# Patient Record
Sex: Female | Born: 1945 | Race: Black or African American | Hispanic: No | Smoking: Never smoker
Health system: Southern US, Community
[De-identification: ages and names within clinical notes are randomized; demographics above are authoritative.]

## PROBLEM LIST (undated history)

## (undated) DIAGNOSIS — D649 Anemia, unspecified: Secondary | ICD-10-CM

## (undated) DIAGNOSIS — I340 Nonrheumatic mitral (valve) insufficiency: Secondary | ICD-10-CM

## (undated) DIAGNOSIS — M1990 Unspecified osteoarthritis, unspecified site: Secondary | ICD-10-CM

## (undated) DIAGNOSIS — E785 Hyperlipidemia, unspecified: Secondary | ICD-10-CM

## (undated) DIAGNOSIS — I428 Other cardiomyopathies: Secondary | ICD-10-CM

## (undated) DIAGNOSIS — N184 Chronic kidney disease, stage 4 (severe): Secondary | ICD-10-CM

## (undated) DIAGNOSIS — I509 Heart failure, unspecified: Secondary | ICD-10-CM

## (undated) DIAGNOSIS — I10 Essential (primary) hypertension: Secondary | ICD-10-CM

## (undated) DIAGNOSIS — E119 Type 2 diabetes mellitus without complications: Secondary | ICD-10-CM

## (undated) HISTORY — PX: ACHILLES TENDON SURGERY: SHX542

## (undated) HISTORY — PX: KNEE SURGERY: SHX244

---

## 1998-02-19 ENCOUNTER — Other Ambulatory Visit: Admission: RE | Admit: 1998-02-19 | Discharge: 1998-02-19 | Payer: Self-pay | Admitting: Obstetrics

## 1999-05-12 ENCOUNTER — Other Ambulatory Visit: Admission: RE | Admit: 1999-05-12 | Discharge: 1999-05-12 | Payer: Self-pay | Admitting: Obstetrics

## 1999-12-12 ENCOUNTER — Encounter (INDEPENDENT_AMBULATORY_CARE_PROVIDER_SITE_OTHER): Payer: Self-pay

## 1999-12-12 ENCOUNTER — Ambulatory Visit (HOSPITAL_COMMUNITY): Admission: RE | Admit: 1999-12-12 | Discharge: 1999-12-12 | Payer: Self-pay | Admitting: *Deleted

## 2001-04-15 ENCOUNTER — Encounter: Payer: Self-pay | Admitting: Cardiovascular Disease

## 2001-04-15 ENCOUNTER — Ambulatory Visit (HOSPITAL_COMMUNITY): Admission: RE | Admit: 2001-04-15 | Discharge: 2001-04-15 | Payer: Self-pay | Admitting: Cardiovascular Disease

## 2002-02-17 ENCOUNTER — Ambulatory Visit (HOSPITAL_COMMUNITY): Admission: RE | Admit: 2002-02-17 | Discharge: 2002-02-17 | Payer: Self-pay | Admitting: *Deleted

## 2002-02-17 ENCOUNTER — Encounter (INDEPENDENT_AMBULATORY_CARE_PROVIDER_SITE_OTHER): Payer: Self-pay | Admitting: Specialist

## 2002-04-13 ENCOUNTER — Emergency Department (HOSPITAL_COMMUNITY): Admission: EM | Admit: 2002-04-13 | Discharge: 2002-04-13 | Payer: Self-pay | Admitting: Emergency Medicine

## 2002-04-13 ENCOUNTER — Encounter: Payer: Self-pay | Admitting: Emergency Medicine

## 2002-05-24 ENCOUNTER — Encounter: Admission: RE | Admit: 2002-05-24 | Discharge: 2002-05-24 | Payer: Self-pay | Admitting: Orthopedic Surgery

## 2002-05-24 ENCOUNTER — Encounter: Payer: Self-pay | Admitting: Orthopedic Surgery

## 2002-09-12 ENCOUNTER — Encounter: Admission: RE | Admit: 2002-09-12 | Discharge: 2002-09-12 | Payer: Self-pay | Admitting: Orthopedic Surgery

## 2002-09-12 ENCOUNTER — Encounter: Payer: Self-pay | Admitting: Orthopedic Surgery

## 2002-10-03 ENCOUNTER — Encounter: Payer: Self-pay | Admitting: Orthopedic Surgery

## 2002-10-03 ENCOUNTER — Encounter: Admission: RE | Admit: 2002-10-03 | Discharge: 2002-10-03 | Payer: Self-pay | Admitting: Orthopedic Surgery

## 2005-06-07 ENCOUNTER — Encounter: Admission: RE | Admit: 2005-06-07 | Discharge: 2005-06-07 | Payer: Self-pay | Admitting: Orthopedic Surgery

## 2006-01-05 ENCOUNTER — Encounter: Admission: RE | Admit: 2006-01-05 | Discharge: 2006-01-05 | Payer: Self-pay | Admitting: Orthopedic Surgery

## 2007-07-06 ENCOUNTER — Encounter: Admission: RE | Admit: 2007-07-06 | Discharge: 2007-07-06 | Payer: Self-pay | Admitting: Cardiovascular Disease

## 2007-12-14 ENCOUNTER — Ambulatory Visit (HOSPITAL_COMMUNITY): Admission: RE | Admit: 2007-12-14 | Discharge: 2007-12-14 | Payer: Self-pay | Admitting: Cardiovascular Disease

## 2008-09-11 ENCOUNTER — Encounter: Admission: RE | Admit: 2008-09-11 | Discharge: 2008-09-11 | Payer: Self-pay | Admitting: Cardiovascular Disease

## 2009-06-12 ENCOUNTER — Encounter: Admission: RE | Admit: 2009-06-12 | Discharge: 2009-06-12 | Payer: Self-pay | Admitting: Cardiovascular Disease

## 2009-07-27 ENCOUNTER — Encounter: Admission: RE | Admit: 2009-07-27 | Discharge: 2009-07-27 | Payer: Self-pay | Admitting: Orthopedic Surgery

## 2009-12-06 ENCOUNTER — Ambulatory Visit (HOSPITAL_COMMUNITY): Admission: RE | Admit: 2009-12-06 | Discharge: 2009-12-06 | Payer: Self-pay | Admitting: Cardiovascular Disease

## 2011-04-17 NOTE — Op Note (Signed)
[name]  Patient:    Dana Walters                           MRN: [id number] Proc. Date: 12/12/99 Adm. Date:  [id number] Attending:  Woody Seller                           Operative Report  PREOPERATIVE DIAGNOSIS:  History of colon polyps.  POSTOPERATIVE DIAGNOSES: 1. Polypoid lesion removed from the proximal ascending colon. 2. Diverticulosis noted.  OPERATION:  Colonoscopy with hot biopsy.  SURGEON:  Woody Seller., M.D.  PREMEDICATION:  Demerol 100 mg IV and Versed 10 mg IV over 10 minute period of time.  INSTRUMENTS:  Olympus video pancolonoscope.  INDICATION:  This pleasant 65 year old female was diagnosed as having a history of colon polyps in the past.  She was subsequently returned back for re-evaluation of the area at this time.  She denies any major complaints of discomfort that is present at this time.  PHYSICAL EXAMINATION:  GENERAL:  She is a pleasant female who appears to be in no acute distress.  VITAL SIGNS:  Stable.  HEENT:  Anicteric.  NECK:  Supple.  LUNGS:  Clear.  HEART:  Regular rate and rhythm without heaves, thrills, murmurs, or gallops.  ABDOMEN:  Soft.  No tenderness.  No hepatosplenomegaly.  EXTREMITIES:  Grossly within normal limits.  PLAN:  I am going to proceed with the colonoscopic examination.  INFORMED CONSENT:  The patient was advised of the procedure, indications, and the risks involved.  The patient has agreed to have the procedure performed.  DESCRIPTION OF PROCEDURE:  The patient was brought to the endoscopy unit where n IV for IV conscious sedating medication was started.  The monitor was placed on the patient to monitor the patients vital signs and oxygen saturation.  Nasal oxygen at two liters per minute was used.  After adequate sedation was performed, the procedure was begun.  The patient was given GoLYTELY and Reglan as a bowel prep.  The patient tolerated the prep  well without any complications.  The quality of the prep was excellent.  The instrument was advanced to the patient lying in the left lateral position approximately 80 cm proximal colon to the cecum.  This was confirmed by palpation, transillumination, and visualization of the ileocecal valve as well as the terminal ileum that was noted.  There appeared to be evidence of a colonic polyp that was appreciated in the proximal ascending colon distal to the cecal pouch that was noted.  This polypoid lesion was removed via hot biopsy at this time.  The size of the polyp appeared to be approximately 0.6 to 0.7 cm in size.  Otherwise there were no other masses, polyps, or strictures appreciated at this time.  The vascular pattern appeared to be well within normal limits throughout the entire colon.  The mucosal pattern showed evidence of diverticulosis present throughout the colon area that was noted.  The diverticulae appeared to be scattered in character and small in nature.  There is no evidence of any inflammatory changes or other diverticulae that was noted or any evidence of complications from the diverticulae that was noted.  There was no increased tortuosity of the colon that was noted nor any evidence f internal or external hemorrhoids upon exiting from the area.  The patient tolerated the procedure well without any  complications that was noted.  TREATMENT:  Conservative management at this time.  Await the pathology report of the biopsy that was obtained.  Would recommend repeating the procedure in approximately one to one and a half years.  We will have follow-up in the office during the interim otherwise.  LEVEL OF DIFFICULTY:  Approximately 3 out of 10.  RECOMMENDATIONS:  Would recommend continuing the same colonoscope without change at this time. DD:  12/12/99 TD:  12/12/99 Job: [id number]

## 2011-04-23 ENCOUNTER — Other Ambulatory Visit: Payer: Self-pay | Admitting: Cardiovascular Disease

## 2011-04-23 ENCOUNTER — Ambulatory Visit
Admission: RE | Admit: 2011-04-23 | Discharge: 2011-04-23 | Disposition: A | Discharge status: Home / Self Care | Payer: Medicare Other | Source: Ambulatory Visit | Attending: Cardiovascular Disease | Admitting: Cardiovascular Disease

## 2011-04-23 DIAGNOSIS — J40 Bronchitis, not specified as acute or chronic: Secondary | ICD-10-CM

## 2013-06-01 ENCOUNTER — Emergency Department (HOSPITAL_COMMUNITY)
Admission: EM | Admit: 2013-06-01 | Discharge: 2013-06-01 | Disposition: A | Discharge status: Home / Self Care | Payer: Medicare Other | Attending: Emergency Medicine | Admitting: Emergency Medicine

## 2013-06-01 ENCOUNTER — Encounter (HOSPITAL_COMMUNITY): Payer: Self-pay | Admitting: Emergency Medicine

## 2013-06-01 DIAGNOSIS — Z79899 Other long term (current) drug therapy: Secondary | ICD-10-CM | POA: Insufficient documentation

## 2013-06-01 DIAGNOSIS — I10 Essential (primary) hypertension: Secondary | ICD-10-CM

## 2013-06-01 DIAGNOSIS — M549 Dorsalgia, unspecified: Secondary | ICD-10-CM

## 2013-06-01 DIAGNOSIS — M545 Low back pain, unspecified: Secondary | ICD-10-CM | POA: Insufficient documentation

## 2013-06-01 DIAGNOSIS — M129 Arthropathy, unspecified: Secondary | ICD-10-CM | POA: Insufficient documentation

## 2013-06-01 DIAGNOSIS — E119 Type 2 diabetes mellitus without complications: Secondary | ICD-10-CM | POA: Insufficient documentation

## 2013-06-01 DIAGNOSIS — M1990 Unspecified osteoarthritis, unspecified site: Secondary | ICD-10-CM

## 2013-06-01 DIAGNOSIS — R51 Headache: Secondary | ICD-10-CM | POA: Insufficient documentation

## 2013-06-01 HISTORY — DX: Essential (primary) hypertension: I10

## 2013-06-01 HISTORY — DX: Type 2 diabetes mellitus without complications: E11.9

## 2013-06-01 HISTORY — DX: Unspecified osteoarthritis, unspecified site: M19.90

## 2013-06-01 MED ORDER — CYCLOBENZAPRINE HCL 10 MG PO TABS: 10.0000 mg | ORAL_TABLET | Freq: Two times a day (BID) | ORAL | Status: DC | PRN

## 2013-06-01 MED ORDER — HYDROCODONE-ACETAMINOPHEN 5-325 MG PO TABS
2.0000 | ORAL_TABLET | Freq: Once | ORAL | Status: AC
  Administered 2013-06-01: 2 via ORAL
  Filled 2013-06-01: qty 2

## 2013-06-01 MED ORDER — DIAZEPAM 5 MG PO TABS
5.0000 mg | ORAL_TABLET | Freq: Once | ORAL | Status: AC
  Administered 2013-06-01: 5 mg via ORAL
  Filled 2013-06-01: qty 1

## 2013-06-01 MED ORDER — HYDROCODONE-ACETAMINOPHEN 5-325 MG PO TABS: 1.0000 | ORAL_TABLET | Freq: Once | ORAL | Status: DC

## 2013-06-01 MED ORDER — DIPHENHYDRAMINE HCL 25 MG PO CAPS
25.0000 mg | ORAL_CAPSULE | Freq: Once | ORAL | Status: AC
  Administered 2013-06-01: 25 mg via ORAL
  Filled 2013-06-01: qty 1

## 2013-06-01 MED ORDER — IBUPROFEN 400 MG PO TABS
600.0000 mg | ORAL_TABLET | Freq: Once | ORAL | Status: DC
  Filled 2013-06-01 (×2): qty 1

## 2013-06-01 MED ORDER — HYDROCODONE-ACETAMINOPHEN 5-325 MG PO TABS: 2.0000 | ORAL_TABLET | ORAL | Status: DC | PRN

## 2013-06-01 MED ORDER — TRAMADOL HCL 50 MG PO TABS
50.0000 mg | ORAL_TABLET | Freq: Once | ORAL | Status: AC
  Administered 2013-06-01: 50 mg via ORAL
  Filled 2013-06-01: qty 1

## 2013-06-01 MED ORDER — NAPROXEN 375 MG PO TABS: 375.0000 mg | ORAL_TABLET | Freq: Two times a day (BID) | ORAL | Status: DC

## 2013-06-01 MED ORDER — DIAZEPAM 5 MG PO TABS
5.0000 mg | ORAL_TABLET | Freq: Once | ORAL | Status: AC
  Administered 2013-06-01: 5 mg via ORAL
  Filled 2013-06-01 (×2): qty 1

## 2013-06-01 NOTE — ED Provider Notes (Signed)
History    CSN: [id number] Arrival date & time 06/01/13  1024  First MD Initiated Contact with Patient 06/01/13 1058     Chief Complaint  Patient presents with  . Back Pain   (Consider location/radiation/quality/duration/timing/severity/associated sxs/prior Treatment) The history is provided by the patient. No language interpreter was used.  Dana Walters is a 67 y/o F with PMHx of HTN, DM, Arthritis, back pain, presenting to the ED, with family, with back pain that became severe starting yesterday after patient was lifting pails of water due to a plumbing issue - was using crutch underneath right arm to keep balance - normally using crutches to walk. Patient reported that the pain is mainly localized to the lower back with mild radiation to the left buttocks and left posterior aspect of thigh, described as being "pulled apart" constant pain. Patient reported that the pain gets worse with motion, hurts to sit down and get up from the toilet, but being in a sit-up position makes the pain feel better. Reported that she was using back patches - denied medications. Reported that she has had back pain for the past 7-8 years, daughter reported that she was supposed to get surgery, but kept putting it off. Denied fever, chills, numbness, tingling, fall, injury, urinary incontinence, bowel incontinence.  Patient reported that she has been using crutches since 1994 PCP Dr. Doylene Canard  Past Medical History  Diagnosis Date  . Hypertension   . Diabetes mellitus without complication   . Arthritis    Past Surgical History  Procedure Laterality Date  . Achilles tendon surgery    . Knee surgery     No family history on file. History  Substance Use Topics  . Smoking status: Never Smoker   . Smokeless tobacco: Not on file  . Alcohol Use: Yes   OB History   Grav Para Term Preterm Abortions TAB SAB Ect Mult Living                 Review of Systems  Constitutional: Negative for fever and chills.   HENT: Negative for neck pain and neck stiffness.   Eyes: Negative for visual disturbance.  Respiratory: Negative for chest tightness and shortness of breath.   Cardiovascular: Negative for chest pain.  Genitourinary: Negative for frequency and difficulty urinating.  Musculoskeletal: Positive for back pain.  Neurological: Positive for headaches. Negative for dizziness, weakness, light-headedness and numbness.  All other systems reviewed and are negative.    Allergies  Percocet and Sular  Home Medications   Current Outpatient Rx  Name  Route  Sig  Dispense  Refill  . cloNIDine (CATAPRES) 0.2 MG tablet   Oral   Take 0.2 mg by mouth at bedtime.         Marland Kitchen esomeprazole (NEXIUM) 40 MG capsule   Oral   Take 40 mg by mouth daily before breakfast.         . fenofibrate (TRICOR) 145 MG tablet   Oral   Take 145 mg by mouth daily.         . furosemide (LASIX) 40 MG tablet   Oral   Take 40 mg by mouth daily.         Marland Kitchen gabapentin (NEURONTIN) 300 MG capsule   Oral   Take 300 mg by mouth 2 (two) times daily as needed (pain).         . metFORMIN (GLUCOPHAGE) 500 MG tablet   Oral   Take 500 mg by mouth 2 (  two) times daily with a meal.         . metoprolol succinate (TOPROL-XL) 100 MG 24 hr tablet   Oral   Take 100 mg by mouth daily. Take with or immediately following a meal.         . olmesartan (BENICAR) 40 MG tablet   Oral   Take 40 mg by mouth daily.         . potassium chloride SA (K-DUR,KLOR-CON) 20 MEQ tablet   Oral   Take 20 mEq by mouth daily.         . sitaGLIPtin (JANUVIA) 100 MG tablet   Oral   Take 100 mg by mouth daily.         . cyclobenzaprine (FLEXERIL) 10 MG tablet   Oral   Take 1 tablet (10 mg total) by mouth 2 (two) times daily as needed for muscle spasms.   20 tablet   0   . HYDROcodone-acetaminophen (NORCO/VICODIN) 5-325 MG per tablet   Oral   Take 2 tablets by mouth every 4 (four) hours as needed for pain.   11 tablet   0    . naproxen (NAPROSYN) 375 MG tablet   Oral   Take 1 tablet (375 mg total) by mouth 2 (two) times daily.   20 tablet   0    BP 161/90  Pulse 78  Temp(Src) 100.4 F (38 C) (Oral)  Resp 18  SpO2 93% Physical Exam  Nursing note and vitals reviewed. Constitutional: She is oriented to person, place, and time. She appears well-developed and well-nourished. No distress.  HENT:  Head: Normocephalic and atraumatic.  Eyes: Conjunctivae and EOM are normal. Pupils are equal, round, and reactive to light. Right eye exhibits no discharge. Left eye exhibits no discharge.  Neck: Normal range of motion. Neck supple.  Cardiovascular: Normal rate, regular rhythm and normal heart sounds.  Exam reveals no friction rub.   No murmur heard. Pulses:      Radial pulses are 2+ on the right side, and 2+ on the left side.       Dorsalis pedis pulses are 2+ on the right side, and 2+ on the left side.  Pulmonary/Chest: Effort normal and breath sounds normal. No respiratory distress. She has no wheezes. She has no rales.  Musculoskeletal: She exhibits tenderness.  Mild decreased ROM to upper and lower extremities secondary to lower back pain Pain upon palpation to the lumbosacral region - mainly to the left  Straight leg raise normal Strength 5+/5+ to upper and lower extremities with resistance  Lymphadenopathy:    She has no cervical adenopathy.  Neurological: She is alert and oriented to person, place, and time. No cranial nerve deficit. She exhibits normal muscle tone. Coordination normal.  Cranial nerves III-XII grossly intact Sensation intact to upper and lower extremities with differentiation to sharp and dull touch  Skin: Skin is warm and dry. No rash noted. She is not diaphoretic. No erythema.  Psychiatric: She has a normal mood and affect. Her behavior is normal. Thought content normal.    ED Course  Procedures (including critical care time) Labs Reviewed - No data to display No results  found. 1. Back pain   2. HTN (hypertension)   3. DM (diabetes mellitus)   4. Arthritis     MDM  Patient presenting to the ED with back pain that has been ongoing for the past 7-8 years - was due to get surgery performed approximately 7 years ago - as  per daughter, but patient kept pushing it off. Negative new injury noted. Negative neurological deficits noted. Negative urinary and bowel issues noted - doubt cauda equina. Patient given pain medications in ED setting - pain improved. Patient stable, afebrile. Discharged patient with anti-inflammatories, muscle relaxer, and pain medications - discussed course, precautions, disposal. Referred patient to spine specialist and PCP. Discussed with patient that she will need an MRI as outpatient to know status of back. Discussed with patient to rest and stay hydrated. Discussed with patient to continue to monitor symptoms and if symptoms are to worsen or change to report back to the ED - strict return instructions given. Patient and family agreed to plan of care, understood, all questions answered.   Jamse Mead, PA-C 06/01/13 1710

## 2013-06-01 NOTE — ED Notes (Signed)
Patient states she has had back problems 7 to 8 years.   Patient states that she was lifting water a couple of days ago and possibly hurt it then.

## 2013-06-02 NOTE — ED Provider Notes (Signed)
Medical screening examination/treatment/procedure(s) were performed by non-physician practitioner and as supervising physician I was immediately available for consultation/collaboration.    Orpah Greek, MD 06/02/13 2330

## 2013-12-11 ENCOUNTER — Encounter (HOSPITAL_COMMUNITY): Payer: Self-pay | Admitting: Emergency Medicine

## 2013-12-11 ENCOUNTER — Emergency Department (HOSPITAL_COMMUNITY)
Admission: EM | Admit: 2013-12-11 | Discharge: 2013-12-11 | Disposition: A | Discharge status: Home / Self Care | Payer: Medicare Other | Attending: Emergency Medicine | Admitting: Emergency Medicine

## 2013-12-11 DIAGNOSIS — Z79899 Other long term (current) drug therapy: Secondary | ICD-10-CM | POA: Insufficient documentation

## 2013-12-11 DIAGNOSIS — I10 Essential (primary) hypertension: Secondary | ICD-10-CM | POA: Insufficient documentation

## 2013-12-11 DIAGNOSIS — M129 Arthropathy, unspecified: Secondary | ICD-10-CM | POA: Insufficient documentation

## 2013-12-11 DIAGNOSIS — L519 Erythema multiforme, unspecified: Secondary | ICD-10-CM

## 2013-12-11 DIAGNOSIS — E119 Type 2 diabetes mellitus without complications: Secondary | ICD-10-CM | POA: Insufficient documentation

## 2013-12-11 MED ORDER — HYDROXYZINE HCL 25 MG PO TABS: 25.0000 mg | ORAL_TABLET | Freq: Four times a day (QID) | ORAL | Status: DC

## 2013-12-11 NOTE — ED Notes (Signed)
Pt reports has not been taking her lasix and has pitting edema to BLE and mild edema to BUE. Pt denies SOB/CP and is in NAD at this time

## 2013-12-11 NOTE — ED Notes (Signed)
Pt reports developing a rash on bilateral legs 5 days ago that has progressively spread to both arms. Denies changing detergents, soaps, or perfume. Reports applying benadryl cream to the sites with no relief. Pt has not taken benadryl tablets for the rash. Denies swelling of throat, mouth, or anywhere on body. Denies pain at this time.

## 2013-12-11 NOTE — ED Notes (Signed)
Robyn PA at bedside

## 2013-12-11 NOTE — ED Notes (Signed)
Patient states she started getting "rash" on her arms, legs.   Patient states it "itches and burns".   Patient denies any other symptoms.

## 2013-12-11 NOTE — ED Provider Notes (Signed)
CSN: [id number]     Arrival date & time 12/11/13  1011 History   None     Chief Complaint  Patient presents with  . Rash   (Consider location/radiation/quality/duration/timing/severity/associated sxs/prior Treatment) The history is provided by the patient. No language interpreter was used.   HPI Comments: Dana Walters is a 68 y.o. female who presents to the Emergency Department complaining of constant worsening rash on arms and legs onset 7 days. Describes rash as spreading, pruritic, and burning. Additionally, reports baseline shortness of breath. Reports symptoms are exacerbated at night. Denies associated fever, new medications, chest pain,  Reports she is out of her medications.   Past Medical History  Diagnosis Date  . Hypertension   . Diabetes mellitus without complication   . Arthritis    Past Surgical History  Procedure Laterality Date  . Achilles tendon surgery    . Knee surgery     No family history on file. History  Substance Use Topics  . Smoking status: Never Smoker   . Smokeless tobacco: Not on file  . Alcohol Use: Yes   OB History   Grav Para Term Preterm Abortions TAB SAB Ect Mult Living                 Review of Systems All other systems negative except as documented in the HPI. All pertinent positives and negatives as reviewed in the HPI. Allergies  Percocet and Sular  Home Medications   Current Outpatient Rx  Name  Route  Sig  Dispense  Refill  . cloNIDine (CATAPRES) 0.2 MG tablet   Oral   Take 0.2 mg by mouth at bedtime.         . cyclobenzaprine (FLEXERIL) 10 MG tablet   Oral   Take 1 tablet (10 mg total) by mouth 2 (two) times daily as needed for muscle spasms.   20 tablet   0   . esomeprazole (NEXIUM) 40 MG capsule   Oral   Take 40 mg by mouth daily before breakfast.         . fenofibrate (TRICOR) 145 MG tablet   Oral   Take 145 mg by mouth daily.         . furosemide (LASIX) 40 MG tablet   Oral   Take 40 mg by mouth  daily.         Marland Kitchen gabapentin (NEURONTIN) 300 MG capsule   Oral   Take 300 mg by mouth 2 (two) times daily as needed (pain).         Marland Kitchen HYDROcodone-acetaminophen (NORCO/VICODIN) 5-325 MG per tablet   Oral   Take 2 tablets by mouth every 4 (four) hours as needed for pain.   11 tablet   0   . metFORMIN (GLUCOPHAGE) 500 MG tablet   Oral   Take 500 mg by mouth 2 (two) times daily with a meal.         . metoprolol succinate (TOPROL-XL) 100 MG 24 hr tablet   Oral   Take 100 mg by mouth daily. Take with or immediately following a meal.         . naproxen (NAPROSYN) 375 MG tablet   Oral   Take 1 tablet (375 mg total) by mouth 2 (two) times daily.   20 tablet   0   . olmesartan (BENICAR) 40 MG tablet   Oral   Take 40 mg by mouth daily.         . potassium chloride SA (  K-DUR,KLOR-CON) 20 MEQ tablet   Oral   Take 20 mEq by mouth daily.         . sitaGLIPtin (JANUVIA) 100 MG tablet   Oral   Take 100 mg by mouth daily.          BP 187/110  Pulse 99  Temp(Src) 97.8 F (36.6 C) (Oral)  Resp 20  Ht 5\' 1"  (1.549 m)  Wt 228 lb (103.42 kg)  BMI 43.10 kg/m2  SpO2 96% Physical Exam  Nursing note and vitals reviewed. Constitutional: She is oriented to person, place, and time. She appears well-developed and well-nourished. No distress.  HENT:  Head: Normocephalic and atraumatic.  Eyes: EOM are normal.  Neck: Neck supple. No tracheal deviation present.  Cardiovascular: Normal rate.   Pulmonary/Chest: Effort normal. No respiratory distress.  Musculoskeletal: Normal range of motion.  Neurological: She is alert and oriented to person, place, and time.  Skin: Skin is warm and dry. Rash noted.  Psychiatric: She has a normal mood and affect. Her behavior is normal.    ED Course  Procedures  COORDINATION OF CARE:  Nursing notes reviewed. Vital signs reviewed. Initial pt interview and examination performed.   Patient most likely has erythema multiforme.  Patient is  advised return here as needed.  Also told her to follow up with her primary care Dr.. told to take Benadryl for any itching     Brent General, PA-C 12/12/13 1607

## 2013-12-11 NOTE — Discharge Instructions (Signed)
Follow up with your doctor.  Return here as needed 

## 2013-12-13 NOTE — ED Provider Notes (Signed)
Medical screening examination/treatment/procedure(s) were conducted as a shared visit with non-physician practitioner(s) and myself.  I personally evaluated the patient during the encounter.  EKG Interpretation   None        Patient here with rash. No mucosal, oral, palmar, sole of foot lesions. No new medications. Rash c/w erythema multiforme. Non-toxic appearing, relaxing comfortably. Instructed to f/u with PCP.  Osvaldo Shipper, MD 12/13/13 [phone]

## 2014-05-16 ENCOUNTER — Ambulatory Visit (INDEPENDENT_AMBULATORY_CARE_PROVIDER_SITE_OTHER): Payer: Medicare Other | Admitting: General Surgery

## 2014-11-21 ENCOUNTER — Encounter (HOSPITAL_COMMUNITY): Payer: Self-pay | Admitting: Nurse Practitioner

## 2014-11-21 ENCOUNTER — Ambulatory Visit
Admission: RE | Admit: 2014-11-21 | Discharge: 2014-11-21 | Disposition: A | Discharge status: Home / Self Care | Payer: Medicare Other | Source: Ambulatory Visit | Attending: Cardiovascular Disease | Admitting: Cardiovascular Disease

## 2014-11-21 ENCOUNTER — Inpatient Hospital Stay (HOSPITAL_COMMUNITY)
Admission: AD | Admit: 2014-11-21 | Discharge: 2014-11-27 | DRG: 287 | Disposition: A | Discharge status: Home / Self Care | Payer: Medicare Other | Source: Ambulatory Visit | Attending: Cardiovascular Disease | Admitting: Cardiovascular Disease

## 2014-11-21 ENCOUNTER — Other Ambulatory Visit: Payer: Self-pay | Admitting: Cardiovascular Disease

## 2014-11-21 DIAGNOSIS — E669 Obesity, unspecified: Secondary | ICD-10-CM | POA: Diagnosis present

## 2014-11-21 DIAGNOSIS — J40 Bronchitis, not specified as acute or chronic: Secondary | ICD-10-CM

## 2014-11-21 DIAGNOSIS — I129 Hypertensive chronic kidney disease with stage 1 through stage 4 chronic kidney disease, or unspecified chronic kidney disease: Secondary | ICD-10-CM | POA: Diagnosis present

## 2014-11-21 DIAGNOSIS — I10 Essential (primary) hypertension: Secondary | ICD-10-CM | POA: Diagnosis present

## 2014-11-21 DIAGNOSIS — N189 Chronic kidney disease, unspecified: Secondary | ICD-10-CM

## 2014-11-21 DIAGNOSIS — I071 Rheumatic tricuspid insufficiency: Secondary | ICD-10-CM | POA: Diagnosis present

## 2014-11-21 DIAGNOSIS — I5021 Acute systolic (congestive) heart failure: Secondary | ICD-10-CM | POA: Diagnosis present

## 2014-11-21 DIAGNOSIS — N179 Acute kidney failure, unspecified: Secondary | ICD-10-CM | POA: Diagnosis not present

## 2014-11-21 DIAGNOSIS — N144 Toxic nephropathy, not elsewhere classified: Secondary | ICD-10-CM | POA: Diagnosis not present

## 2014-11-21 DIAGNOSIS — Z885 Allergy status to narcotic agent status: Secondary | ICD-10-CM | POA: Diagnosis not present

## 2014-11-21 DIAGNOSIS — E119 Type 2 diabetes mellitus without complications: Secondary | ICD-10-CM | POA: Diagnosis present

## 2014-11-21 DIAGNOSIS — I340 Nonrheumatic mitral (valve) insufficiency: Secondary | ICD-10-CM | POA: Diagnosis present

## 2014-11-21 DIAGNOSIS — F17220 Nicotine dependence, chewing tobacco, uncomplicated: Secondary | ICD-10-CM | POA: Diagnosis present

## 2014-11-21 DIAGNOSIS — Z79899 Other long term (current) drug therapy: Secondary | ICD-10-CM

## 2014-11-21 DIAGNOSIS — I420 Dilated cardiomyopathy: Secondary | ICD-10-CM | POA: Diagnosis present

## 2014-11-21 DIAGNOSIS — Z882 Allergy status to sulfonamides status: Secondary | ICD-10-CM | POA: Diagnosis not present

## 2014-11-21 DIAGNOSIS — N183 Chronic kidney disease, stage 3 (moderate): Secondary | ICD-10-CM | POA: Diagnosis present

## 2014-11-21 DIAGNOSIS — R0602 Shortness of breath: Secondary | ICD-10-CM | POA: Diagnosis present

## 2014-11-21 DIAGNOSIS — M1990 Unspecified osteoarthritis, unspecified site: Secondary | ICD-10-CM | POA: Diagnosis present

## 2014-11-21 LAB — CBC WITH DIFFERENTIAL/PLATELET
Basophils Absolute: 0.1 10*3/uL (ref 0.0–0.1)
Basophils Relative: 1 % (ref 0–1)
EOS ABS: 0 10*3/uL (ref 0.0–0.7)
Eosinophils Relative: 0 % (ref 0–5)
HEMATOCRIT: 36.9 % (ref 36.0–46.0)
HEMOGLOBIN: 11.7 g/dL — AB (ref 12.0–15.0)
LYMPHS ABS: 2.8 10*3/uL (ref 0.7–4.0)
Lymphocytes Relative: 37 % (ref 12–46)
MCH: 26.8 pg (ref 26.0–34.0)
MCHC: 31.7 g/dL (ref 30.0–36.0)
MCV: 84.6 fL (ref 78.0–100.0)
MONO ABS: 0.7 10*3/uL (ref 0.1–1.0)
MONOS PCT: 10 % (ref 3–12)
NEUTROS ABS: 3.9 10*3/uL (ref 1.7–7.7)
NEUTROS PCT: 52 % (ref 43–77)
Platelets: 289 10*3/uL (ref 150–400)
RBC: 4.36 MIL/uL (ref 3.87–5.11)
RDW: 14.3 % (ref 11.5–15.5)
WBC: 7.6 10*3/uL (ref 4.0–10.5)

## 2014-11-21 LAB — COMPREHENSIVE METABOLIC PANEL
ALT: 23 U/L (ref 0–35)
ANION GAP: 9 (ref 5–15)
AST: 28 U/L (ref 0–37)
Albumin: 3.5 g/dL (ref 3.5–5.2)
Alkaline Phosphatase: 42 U/L (ref 39–117)
BILIRUBIN TOTAL: 2 mg/dL — AB (ref 0.3–1.2)
BUN: 17 mg/dL (ref 6–23)
CHLORIDE: 106 meq/L (ref 96–112)
CO2: 25 mmol/L (ref 19–32)
Calcium: 9.5 mg/dL (ref 8.4–10.5)
Creatinine, Ser: 1.65 mg/dL — ABNORMAL HIGH (ref 0.50–1.10)
GFR calc non Af Amer: 31 mL/min — ABNORMAL LOW (ref >90)
GFR, EST AFRICAN AMERICAN: 36 mL/min — AB (ref >90)
GLUCOSE: 139 mg/dL — AB (ref 70–99)
Potassium: 4 mmol/L (ref 3.5–5.1)
Sodium: 140 mmol/L (ref 135–145)
Total Protein: 7.1 g/dL (ref 6.0–8.3)

## 2014-11-21 LAB — TROPONIN I
Troponin I: 0.17 ng/mL — ABNORMAL HIGH (ref <0.031)
Troponin I: 0.17 ng/mL — ABNORMAL HIGH (ref <0.031)

## 2014-11-21 LAB — CBC
HEMATOCRIT: 37.2 % (ref 36.0–46.0)
HEMOGLOBIN: 11.7 g/dL — AB (ref 12.0–15.0)
MCH: 26.6 pg (ref 26.0–34.0)
MCHC: 31.5 g/dL (ref 30.0–36.0)
MCV: 84.5 fL (ref 78.0–100.0)
Platelets: 307 10*3/uL (ref 150–400)
RBC: 4.4 MIL/uL (ref 3.87–5.11)
RDW: 14.3 % (ref 11.5–15.5)
WBC: 7.7 10*3/uL (ref 4.0–10.5)

## 2014-11-21 LAB — CREATININE, SERUM
Creatinine, Ser: 1.61 mg/dL — ABNORMAL HIGH (ref 0.50–1.10)
GFR calc Af Amer: 37 mL/min — ABNORMAL LOW (ref >90)
GFR, EST NON AFRICAN AMERICAN: 32 mL/min — AB (ref >90)

## 2014-11-21 LAB — TSH: TSH: 2.452 u[IU]/mL (ref 0.350–4.500)

## 2014-11-21 LAB — GLUCOSE, CAPILLARY
Glucose-Capillary: 145 mg/dL — ABNORMAL HIGH (ref 70–99)
Glucose-Capillary: 137 mg/dL — ABNORMAL HIGH (ref 70–99)

## 2014-11-21 MED ORDER — ONDANSETRON HCL 4 MG/2ML IJ SOLN
4.0000 mg | Freq: Four times a day (QID) | INTRAMUSCULAR | Status: DC | PRN
  Administered 2014-11-21: 4 mg via INTRAVENOUS
  Filled 2014-11-21: qty 2

## 2014-11-21 MED ORDER — FUROSEMIDE 10 MG/ML IJ SOLN
80.0000 mg | Freq: Two times a day (BID) | INTRAMUSCULAR | Status: DC
  Administered 2014-11-21 – 2014-11-23 (×4): 80 mg via INTRAVENOUS
  Filled 2014-11-21 (×6): qty 8

## 2014-11-21 MED ORDER — SODIUM CHLORIDE 0.9 % IJ SOLN
3.0000 mL | Freq: Two times a day (BID) | INTRAMUSCULAR | Status: DC
  Administered 2014-11-21 – 2014-11-27 (×10): 3 mL via INTRAVENOUS

## 2014-11-21 MED ORDER — INSULIN ASPART 100 UNIT/ML ~~LOC~~ SOLN
0.0000 [IU] | Freq: Three times a day (TID) | SUBCUTANEOUS | Status: DC
  Administered 2014-11-23 – 2014-11-24 (×4): 2 [IU] via SUBCUTANEOUS
  Administered 2014-11-24: 3 [IU] via SUBCUTANEOUS
  Administered 2014-11-25 (×3): 2 [IU] via SUBCUTANEOUS
  Administered 2014-11-26: 3 [IU] via SUBCUTANEOUS

## 2014-11-21 MED ORDER — CLONIDINE HCL 0.2 MG PO TABS
0.2000 mg | ORAL_TABLET | Freq: Every day | ORAL | Status: DC
  Administered 2014-11-21 – 2014-11-23 (×3): 0.2 mg via ORAL
  Filled 2014-11-21 (×4): qty 1

## 2014-11-21 MED ORDER — HYDROXYZINE HCL 25 MG PO TABS
25.0000 mg | ORAL_TABLET | Freq: Four times a day (QID) | ORAL | Status: DC
  Administered 2014-11-21 – 2014-11-25 (×15): 25 mg via ORAL
  Filled 2014-11-21 (×21): qty 1

## 2014-11-21 MED ORDER — METFORMIN HCL 500 MG PO TABS
500.0000 mg | ORAL_TABLET | Freq: Two times a day (BID) | ORAL | Status: DC
  Administered 2014-11-21: 500 mg via ORAL
  Filled 2014-11-21 (×2): qty 1

## 2014-11-21 MED ORDER — SODIUM CHLORIDE 0.9 % IJ SOLN: 3.0000 mL | INTRAMUSCULAR | Status: DC | PRN

## 2014-11-21 MED ORDER — LISINOPRIL 5 MG PO TABS
5.0000 mg | ORAL_TABLET | Freq: Every day | ORAL | Status: DC
  Administered 2014-11-21: 5 mg via ORAL
  Filled 2014-11-21 (×2): qty 1

## 2014-11-21 MED ORDER — PNEUMOCOCCAL VAC POLYVALENT 25 MCG/0.5ML IJ INJ
0.5000 mL | INJECTION | INTRAMUSCULAR | Status: AC
  Administered 2014-11-22: 0.5 mL via INTRAMUSCULAR
  Filled 2014-11-21: qty 0.5

## 2014-11-21 MED ORDER — ASPIRIN EC 81 MG PO TBEC
81.0000 mg | DELAYED_RELEASE_TABLET | Freq: Every day | ORAL | Status: DC
  Administered 2014-11-21 – 2014-11-27 (×7): 81 mg via ORAL
  Filled 2014-11-21 (×7): qty 1

## 2014-11-21 MED ORDER — SODIUM CHLORIDE 0.9 % IV SOLN: 250.0000 mL | INTRAVENOUS | Status: DC | PRN

## 2014-11-21 MED ORDER — HEPARIN SODIUM (PORCINE) 5000 UNIT/ML IJ SOLN
5000.0000 [IU] | Freq: Three times a day (TID) | INTRAMUSCULAR | Status: DC
  Administered 2014-11-21 – 2014-11-27 (×17): 5000 [IU] via SUBCUTANEOUS
  Filled 2014-11-21 (×19): qty 1

## 2014-11-21 MED ORDER — CARVEDILOL 3.125 MG PO TABS
3.1250 mg | ORAL_TABLET | Freq: Two times a day (BID) | ORAL | Status: DC
  Administered 2014-11-21 – 2014-11-27 (×12): 3.125 mg via ORAL
  Filled 2014-11-21 (×14): qty 1

## 2014-11-21 MED ORDER — FUROSEMIDE 10 MG/ML IJ SOLN
40.0000 mg | Freq: Two times a day (BID) | INTRAMUSCULAR | Status: DC
  Administered 2014-11-21: 40 mg via INTRAVENOUS
  Filled 2014-11-21: qty 4

## 2014-11-21 MED ORDER — ALPRAZOLAM 0.25 MG PO TABS: 0.2500 mg | ORAL_TABLET | Freq: Two times a day (BID) | ORAL | Status: DC | PRN

## 2014-11-21 MED ORDER — INFLUENZA VAC SPLIT QUAD 0.5 ML IM SUSY
0.5000 mL | PREFILLED_SYRINGE | INTRAMUSCULAR | Status: AC
  Administered 2014-11-22: 0.5 mL via INTRAMUSCULAR
  Filled 2014-11-21: qty 0.5

## 2014-11-21 NOTE — Progress Notes (Signed)
PHARMACIST - PHYSICIAN COMMUNICATION  CONCERNING:  METFORMIN SAFE ADMINISTRATION POLICY  RECOMMENDATION: Metformin has been placed on DISCONTINUE (rejected order) STATUS and should be reordered only after any of the conditions below are ruled out.  Current safety recommendations include avoiding metformin for a minimum of 48 hours after the patient's exposure to intravenous contrast media.  DESCRIPTION:  The Pharmacy Committee has adopted a policy that restricts the use of metformin in hospitalized patients until all the contraindications to administration have been ruled out. Specific contraindications are: []  Serum creatinine ? 1.5 for males [x]  Serum creatinine ? 1.4 for females []  Shock, acute MI, sepsis, hypoxemia, dehydration []  Planned administration of intravenous iodinated contrast media []  Heart Failure patients with low EF []  Acute or chronic metabolic acidosis (including DKA)      Hildred Laser, Pharm D 11/21/2014 6:03 PM

## 2014-11-21 NOTE — H&P (Signed)
Referring Physician:  [name] is an 68 y.o. female.                       Chief Complaint: Shortness of breath  HPI: 68 year old female with cough and cold now has shortness of breath x 2 weeks. No fever. Chest x-ray is suggestive of CHF. Past history of hypertension, obesity and diabetes, type II.  Past Medical History  Diagnosis Date  . Hypertension   . Diabetes mellitus without complication   . Arthritis       Past Surgical History  Procedure Laterality Date  . Achilles tendon surgery    . Knee surgery      History reviewed. No pertinent family history. Social History:  reports that she has never smoked. Her smokeless tobacco use includes Snuff. She reports that she does not drink alcohol or use illicit drugs.  Allergies:  Allergies  Allergen Reactions  . Percocet [Oxycodone-Acetaminophen] Itching  . Sular [Nisoldipine Er] Itching    Medications Prior to Admission  Medication Sig Dispense Refill  . cloNIDine (CATAPRES) 0.2 MG tablet Take 0.2 mg by mouth at bedtime.    . diphenhydrAMINE-zinc acetate (BENADRYL) cream Apply 1 application topically daily as needed for itching.    . hydrOXYzine (ATARAX/VISTARIL) 25 MG tablet Take 1 tablet (25 mg total) by mouth every 6 (six) hours. 12 tablet 0  . metFORMIN (GLUCOPHAGE) 500 MG tablet Take 500 mg by mouth 2 (two) times daily with a meal.      No results found for this or any previous visit (from the past 48 hour(s)). Dg Chest 2 View  11/21/2014   CLINICAL DATA:  Shortness of breath.  EXAM: CHEST  2 VIEW  COMPARISON:  04/23/2011.  FINDINGS: Mediastinum and hilar structures normal. Cardiomegaly with mild pulmonary vascular prominence noted. Minimal interstitial prominence. Mild component congestive heart failure cannot be excluded. Tiny right pleural effusion cannot be excluded. No pneumothorax. No acute osseous abnormality .  IMPRESSION: Mild congestive heart failure.   Electronically Signed   By: Marcello Moores  Register   On:  11/21/2014 13:19    Review Of Systems Constitutional: Positive for malaise/fatigue. Negative for fever and chills.  HENT: Negative for hearing loss. Eyes: Negative for blurred vision and double vision. Wears glasses   Respiratory: Positive for cough, sputum production and shortness of breath.  Cardiovascular: Positive for orthopnea, chest pain, palpitations, claudication and leg swelling.  Gastrointestinal: Positive for nausea, vomiting and abdominal pain.  Genitourinary: Positive for dysuria.  Neurological: Positive for dizziness and headaches.   Blood pressure 180/113, pulse 90, temperature 98.2 F (36.8 C), temperature source Oral, resp. rate 18, height 5\' 2"  (1.575 m), weight 93.8 kg (206 lb 12.7 oz), SpO2 100 %. Physical Exam: HEENT: [place]/AT, Eyes-Brown, PERL, EOMI, Conjunctiva- pink, Sclera-Non-icteric. Wears glasses.  Neck: + JVD, No bruit, Trachea midline.  Lungs: Basal crackles, Bilateral.  Cardiac: Regular rhythm, normal S1 and S2, no S3. II/VI systolic murmur. Abdomen: Soft, non-tender.  Extremities: 2 + edema present. No cyanosis. No clubbing.  CNS: AxOx3, Cranial nerves grossly intact, moves all 4 extremities. Right handed. Ambulates with crutches. Skin: Warm and dry.  Assessment/Plan Acute left heart systolic failure DM, Ii Hypertension Obesity S/P knee surgery  Dana Riddle, MD  11/21/2014, 3:03 PM

## 2014-11-22 ENCOUNTER — Encounter (HOSPITAL_COMMUNITY)
Admission: AD | Disposition: A | Discharge status: Home / Self Care | Payer: Self-pay | Source: Ambulatory Visit | Attending: Cardiovascular Disease

## 2014-11-22 ENCOUNTER — Encounter (HOSPITAL_COMMUNITY): Payer: Self-pay | Admitting: Cardiovascular Disease

## 2014-11-22 DIAGNOSIS — I5021 Acute systolic (congestive) heart failure: Secondary | ICD-10-CM | POA: Diagnosis not present

## 2014-11-22 HISTORY — PX: LEFT AND RIGHT HEART CATHETERIZATION WITH CORONARY ANGIOGRAM: SHX5449

## 2014-11-22 LAB — BASIC METABOLIC PANEL
Anion gap: 9 (ref 5–15)
BUN: 17 mg/dL (ref 6–23)
CHLORIDE: 104 meq/L (ref 96–112)
CO2: 27 mmol/L (ref 19–32)
Calcium: 8.8 mg/dL (ref 8.4–10.5)
Creatinine, Ser: 1.98 mg/dL — ABNORMAL HIGH (ref 0.50–1.10)
GFR calc non Af Amer: 25 mL/min — ABNORMAL LOW (ref >90)
GFR, EST AFRICAN AMERICAN: 29 mL/min — AB (ref >90)
GLUCOSE: 118 mg/dL — AB (ref 70–99)
POTASSIUM: 3.8 mmol/L (ref 3.5–5.1)
Sodium: 140 mmol/L (ref 135–145)

## 2014-11-22 LAB — GLUCOSE, CAPILLARY
GLUCOSE-CAPILLARY: 117 mg/dL — AB (ref 70–99)
Glucose-Capillary: 119 mg/dL — ABNORMAL HIGH (ref 70–99)
Glucose-Capillary: 130 mg/dL — ABNORMAL HIGH (ref 70–99)
Glucose-Capillary: 100 mg/dL — ABNORMAL HIGH (ref 70–99)
Glucose-Capillary: 146 mg/dL — ABNORMAL HIGH (ref 70–99)

## 2014-11-22 LAB — POCT I-STAT 3, VENOUS BLOOD GAS (G3P V)
Bicarbonate: 26.6 mEq/L — ABNORMAL HIGH (ref 20.0–24.0)
O2 SAT: 57 %
TCO2: 28 mmol/L (ref 0–100)
pCO2, Ven: 50.6 mmHg — ABNORMAL HIGH (ref 45.0–50.0)
pH, Ven: 7.328 — ABNORMAL HIGH (ref 7.250–7.300)
pO2, Ven: 32 mmHg (ref 30.0–45.0)

## 2014-11-22 LAB — TROPONIN I: Troponin I: 0.15 ng/mL — ABNORMAL HIGH (ref <0.031)

## 2014-11-22 LAB — POCT I-STAT 3, ART BLOOD GAS (G3+)
ACID-BASE DEFICIT: 1 mmol/L (ref 0.0–2.0)
Bicarbonate: 25.3 mEq/L — ABNORMAL HIGH (ref 20.0–24.0)
O2 Saturation: 93 %
PH ART: 7.345 — AB (ref 7.350–7.450)
TCO2: 27 mmol/L (ref 0–100)
pCO2 arterial: 46.2 mmHg — ABNORMAL HIGH (ref 35.0–45.0)
pO2, Arterial: 72 mmHg — ABNORMAL LOW (ref 80.0–100.0)

## 2014-11-22 LAB — PROTIME-INR
INR: 1.27 (ref 0.00–1.49)
Prothrombin Time: 16.1 seconds — ABNORMAL HIGH (ref 11.6–15.2)

## 2014-11-22 LAB — HEMOGLOBIN A1C
HEMOGLOBIN A1C: 6.8 % — AB (ref <5.7)
MEAN PLASMA GLUCOSE: 148 mg/dL — AB (ref <117)

## 2014-11-22 SURGERY — LEFT HEART CATHETERIZATION WITH CORONARY ANGIOGRAM
Anesthesia: LOCAL

## 2014-11-22 SURGERY — LEFT AND RIGHT HEART CATHETERIZATION WITH CORONARY ANGIOGRAM
Anesthesia: Moderate Sedation

## 2014-11-22 MED ORDER — HEPARIN (PORCINE) IN NACL 2-0.9 UNIT/ML-% IJ SOLN
INTRAMUSCULAR | Status: AC
  Filled 2014-11-22: qty 1000

## 2014-11-22 MED ORDER — GABAPENTIN 300 MG PO CAPS
300.0000 mg | ORAL_CAPSULE | Freq: Two times a day (BID) | ORAL | Status: DC
  Administered 2014-11-22 – 2014-11-27 (×9): 300 mg via ORAL
  Filled 2014-11-22 (×11): qty 1

## 2014-11-22 MED ORDER — LIDOCAINE HCL (PF) 1 % IJ SOLN
INTRAMUSCULAR | Status: AC
  Filled 2014-11-22: qty 30

## 2014-11-22 MED ORDER — ALBUTEROL SULFATE (2.5 MG/3ML) 0.083% IN NEBU
2.5000 mg | INHALATION_SOLUTION | Freq: Four times a day (QID) | RESPIRATORY_TRACT | Status: DC | PRN
  Administered 2014-11-23 – 2014-11-26 (×2): 2.5 mg via RESPIRATORY_TRACT
  Filled 2014-11-22 (×2): qty 3

## 2014-11-22 MED ORDER — SODIUM CHLORIDE 0.9 % IV SOLN
INTRAVENOUS | Status: DC
  Administered 2014-11-22: 1000 mL via INTRAVENOUS

## 2014-11-22 MED ORDER — MIDAZOLAM HCL 2 MG/2ML IJ SOLN
INTRAMUSCULAR | Status: AC
  Filled 2014-11-22: qty 2

## 2014-11-22 MED ORDER — FENTANYL CITRATE 0.05 MG/ML IJ SOLN
INTRAMUSCULAR | Status: AC
  Filled 2014-11-22: qty 2

## 2014-11-22 MED ORDER — NITROGLYCERIN 1 MG/10 ML FOR IR/CATH LAB
INTRA_ARTERIAL | Status: AC
  Filled 2014-11-22: qty 10

## 2014-11-22 MED ORDER — FENOFIBRATE 160 MG PO TABS
160.0000 mg | ORAL_TABLET | Freq: Every day | ORAL | Status: DC
  Administered 2014-11-22 – 2014-11-27 (×6): 160 mg via ORAL
  Filled 2014-11-22 (×7): qty 1

## 2014-11-22 MED ORDER — HEPARIN (PORCINE) IN NACL 2-0.9 UNIT/ML-% IJ SOLN
INTRAMUSCULAR | Status: AC
  Filled 2014-11-22: qty 500

## 2014-11-22 MED ORDER — ALBUTEROL SULFATE HFA 108 (90 BASE) MCG/ACT IN AERS: 2.0000 | INHALATION_SPRAY | Freq: Four times a day (QID) | RESPIRATORY_TRACT | Status: DC | PRN

## 2014-11-22 MED ORDER — SODIUM CHLORIDE 0.9 % IJ SOLN
3.0000 mL | Freq: Two times a day (BID) | INTRAMUSCULAR | Status: DC
Start: 2014-11-22 — End: 2014-11-22
  Administered 2014-11-22: 3 mL via INTRAVENOUS

## 2014-11-22 MED ORDER — LINAGLIPTIN 5 MG PO TABS
5.0000 mg | ORAL_TABLET | Freq: Every day | ORAL | Status: DC
  Administered 2014-11-22 – 2014-11-27 (×6): 5 mg via ORAL
  Filled 2014-11-22 (×7): qty 1

## 2014-11-22 MED ORDER — POTASSIUM CHLORIDE CRYS ER 20 MEQ PO TBCR
20.0000 meq | EXTENDED_RELEASE_TABLET | Freq: Two times a day (BID) | ORAL | Status: DC
  Administered 2014-11-22 – 2014-11-23 (×3): 20 meq via ORAL
  Filled 2014-11-22 (×5): qty 1

## 2014-11-22 MED ORDER — SODIUM CHLORIDE 0.9 % IJ SOLN: 3.0000 mL | INTRAMUSCULAR | Status: DC | PRN

## 2014-11-22 MED ORDER — SODIUM CHLORIDE 0.9 % IV SOLN: 250.0000 mL | INTRAVENOUS | Status: DC | PRN

## 2014-11-22 MED ORDER — PANTOPRAZOLE SODIUM 40 MG PO TBEC
40.0000 mg | DELAYED_RELEASE_TABLET | Freq: Every day | ORAL | Status: DC
  Administered 2014-11-22 – 2014-11-27 (×6): 40 mg via ORAL
  Filled 2014-11-22 (×6): qty 1

## 2014-11-22 NOTE — Care Management Note (Addendum)
    Page 1 of 2   11/27/2014     10:43:56 AM CARE MANAGEMENT NOTE 11/27/2014  Patient:  Dana Walters, Dana Walters   Account Number:  [id number]  Date Initiated:  11/22/2014  Documentation initiated by:  HUTCHINSON,CRYSTAL  Subjective/Objective Assessment:   CHF - New     Action/Plan:   CM to follow for disposition needs   Anticipated DC Date:  11/28/2014   Anticipated DC Plan:  [place]  CM consult      [facility]  HOME HEALTH   Choice offered to / List presented to:  C-4 Adult Children        [place] arranged  HH-1 RN  [place] PT      [place].   Status of service:  Completed, signed off Medicare Important Message given?  YES (If response is "NO", the following Medicare IM given date fields will be blank) Date Medicare IM given:  11/26/2014 Medicare IM given by:  Jayvion Stefanski Date Additional Medicare IM given:   Additional Medicare IM given by:    Discharge Disposition:  [place]  Per UR Regulation:  Reviewed for med. necessity/level of care/duration of stay  If discussed at [place] of Stay Meetings, dates discussed:   11/27/2014    Comments:  11/27/14 Ellan Lambert, RN, BSN [phone] Pt for dc home today with daughter.  Will need HH follow up; referral to [facility], per choice, as she has used this agency in the past.  Pt ambulates with crutches, and denies any DME needs for home.  Start of care for [facility] 24-48h post dc date.  Crystal Hutchinson RN, BSN, MSHL, CCM  Nurse - Case Manager,  (Unit [phone]   11/22/2014 Heart Cath pending CM will continue to monitor for disposition needs

## 2014-11-22 NOTE — Progress Notes (Signed)
  Echocardiogram 2D Echocardiogram has been performed.  Minus Breeding A 11/22/2014, 1:00 PM

## 2014-11-22 NOTE — Progress Notes (Signed)
Site:  5 fr rfa and 7 fr rfv groin  Site Prior to Removal:  Level 0  Pressure Applied For 20  MINUTES    Minutes Beginning at 1430  Manual:   Yes.    Patient Status During Pull:  stable  Post Pull Groin Site:  Level 0  Post Pull Instructions Given:  Yes.    Post Pull Pulses Present:  Yes.    Dressing Applied:  Yes.    Comments:  Pt. Tolerated sheath pull well.cbg 100 via finger stick.  Pt denies any discomfort at site.

## 2014-11-22 NOTE — Progress Notes (Signed)
Pt back from cath lab at 1455 via stretcher.  A&0x4.  Denies pain.  Dsg. To Rt. Groin D&I.  Instructed on being on bed rest for 4 hours and to keep Rt leg straight until after bedrest.  Verbalized understanding.  Karie Kirks, Therapist, sports.

## 2014-11-22 NOTE — Progress Notes (Signed)
Informed cath lab pt is ready.  Spoke with [place] who informed nurse that she will pass it on as they are in a procedure at this time.  Will continue to monitor.  Karie Kirks, Therapist, sports.

## 2014-11-22 NOTE — Interval H&P Note (Signed)
History and Physical Interval Note:  11/22/2014 1:25 PM  Dana Walters  has presented today for surgery, with the diagnosis of CHF  The various methods of treatment have been discussed with the patient and family. After consideration of risks, benefits and other options for treatment, the patient has consented to  Procedure(s): LEFT AND RIGHT HEART CATHETERIZATION WITH CORONARY ANGIOGRAM (N/A) as a surgical intervention .  The patient's history has been reviewed, patient examined, no change in status, stable for surgery.  I have reviewed the patient's chart and labs.  Questions were answered to the patient's satisfaction.     Dana Walters S

## 2014-11-22 NOTE — CV Procedure (Signed)
PROCEDURE:  Right and Left heart catheterization with selective coronary angiography, left ventriculogram.  CLINICAL HISTORY:  This is a 68 year old female presented with congestive heart failure.  The risks, benefits, and details of the procedure were explained to the patient.  The patient verbalized understanding and wanted to proceed.  Informed written consent was obtained.  PROCEDURE TECHNIQUE:  The patient was approached from the right femoral vein using 7 French short sheath and right femoral artery using a 5 French short sheath. Right heart pressures, cardiac output and oxygen saturation were measured using a Swan Ganz catheter placed through right femoral vein. Left coronary angiography was done using a Judkins L4 guide catheter.  Right coronary angiography was done using a Judkins R4 guide catheter. Left ventriculography was not done using a pigtail catheter.    CONTRAST:  Total of 40 cc.  COMPLICATIONS:  None.  At the end of the procedure a manual device was used for hemostasis.    HEMODYNAMICS:  Pulmonary pressure was 41/9. Wedge pressure was 14. RV pressure was 35/5: RA pressure was 12.   Oxygen saturation of blood sample from PA was 57 % and AO was 93 %.  Aortic pressure was 115/67; LV pressure was 112/10; LVEDP 16.  There was no gradient between the left ventricle and aorta.    Cardiac output was 4.5 L/min by Fick method and 4.79 L/Min by Thermodilution.  ANGIOGRAM/CORONARY ARTERIOGRAM:   The left main coronary artery is unremarkable.  The left anterior descending artery is unremarkable.  The left circumflex artery is unremarkable.Ramus is larger vessel and unremarkable.  The right coronary artery is dominant and unremarkable.  LEFT VENTRICULOGRAM:  Left ventricular angiogram was not done to conserve dye use. LVEDP was 16 mmHg.  IMPRESSION OF HEART CATHETERIZATION:   1. Normal left main coronary artery. 2. Normal left anterior descending artery and its  branches. 3. Normal left circumflex artery and its branches. 4. Normal right coronary artery. 5. Normal left ventricular systolic function.  LVEDP 16 mmHg. 6. Mildly elevated PA and wedge pressures.  RECOMMENDATION:   Medical treatment.

## 2014-11-23 LAB — BASIC METABOLIC PANEL
ANION GAP: 9 (ref 5–15)
BUN: 25 mg/dL — ABNORMAL HIGH (ref 6–23)
CHLORIDE: 100 meq/L (ref 96–112)
CO2: 30 mmol/L (ref 19–32)
Calcium: 8.8 mg/dL (ref 8.4–10.5)
Creatinine, Ser: 2.66 mg/dL — ABNORMAL HIGH (ref 0.50–1.10)
GFR calc non Af Amer: 17 mL/min — ABNORMAL LOW (ref >90)
GFR, EST AFRICAN AMERICAN: 20 mL/min — AB (ref >90)
Glucose, Bld: 150 mg/dL — ABNORMAL HIGH (ref 70–99)
Potassium: 4 mmol/L (ref 3.5–5.1)
Sodium: 139 mmol/L (ref 135–145)

## 2014-11-23 LAB — GLUCOSE, CAPILLARY
GLUCOSE-CAPILLARY: 137 mg/dL — AB (ref 70–99)
GLUCOSE-CAPILLARY: 148 mg/dL — AB (ref 70–99)
Glucose-Capillary: 142 mg/dL — ABNORMAL HIGH (ref 70–99)
Glucose-Capillary: 147 mg/dL — ABNORMAL HIGH (ref 70–99)

## 2014-11-23 MED ORDER — HYDROCODONE-ACETAMINOPHEN 5-325 MG PO TABS
1.0000 | ORAL_TABLET | Freq: Four times a day (QID) | ORAL | Status: DC | PRN
  Administered 2014-11-23 – 2014-11-24 (×3): 1 via ORAL
  Filled 2014-11-23 (×4): qty 1

## 2014-11-23 NOTE — Progress Notes (Signed)
Patient sleeping peacefully. Maintained on telemetry and is in NSR on the monitor, with a heart rate of 84. Patient also having occasional PVCS without symptoms noted. Patient also had a 6 beat run of bigeminy earlier in the shift without symptoms noted. MD made aware. Will continue to monitor.  Esperanza Heir, RN

## 2014-11-23 NOTE — Progress Notes (Signed)
Ref: [name], MD  Subjective:  Feeling better. Breathing better. Aware of absence of CAD but poor LV systolic function and need to decrease salt and fluids intake, decrease weight and increase walking.  Objective:  Vital Signs in the last 24 hours: Temp:  [98.2 F (36.8 C)-98.4 F (36.9 C)] 98.4 F (36.9 C) (12/25 0556) Pulse Rate:  [66-90] 90 (12/25 0810) Cardiac Rhythm:  [-] Normal sinus rhythm (12/25 0800) Resp:  [17-25] 20 (12/25 0556) BP: (100-166)/(55-82) 100/55 mmHg (12/25 0810) SpO2:  [91 %-100 %] 92 % (12/25 0556) Weight:  [89.7 kg (197 lb 12 oz)] 89.7 kg (197 lb 12 oz) (12/25 0556)  Physical Exam: BP Readings from Last 1 Encounters:  11/23/14 100/55    Wt Readings from Last 1 Encounters:  11/23/14 89.7 kg (197 lb 12 oz)    Weight change: -4.1 kg (-9 lb 0.6 oz)  HEENT: [place]/AT, Eyes-Brown, PERL, EOMI, Conjunctiva-Pink, Sclera-Non-icteric Neck: No JVD, No bruit, Trachea midline. Lungs:  Clearing, Bilateral. Cardiac:  Regular rhythm, normal S1 and S2, no S3.  Abdomen:  Soft, non-tender. Extremities:  Trace edema present. No cyanosis. No clubbing. CNS: AxOx3, Cranial nerves grossly intact, moves all 4 extremities. Right handed. Skin: Warm and dry.   Intake/Output from previous day: 12/24 0701 - 12/25 0700 In: 120 [P.O.:120] Out: 1425 [Urine:1425]    Lab Results: BMET    Component Value Date/Time   NA 139 11/23/2014 0357   NA 140 11/22/2014 0407   NA 140 11/21/2014 1636   K 4.0 11/23/2014 0357   K 3.8 11/22/2014 0407   K 4.0 11/21/2014 1636   CL 100 11/23/2014 0357   CL 104 11/22/2014 0407   CL 106 11/21/2014 1636   CO2 30 11/23/2014 0357   CO2 27 11/22/2014 0407   CO2 25 11/21/2014 1636   GLUCOSE 150* 11/23/2014 0357   GLUCOSE 118* 11/22/2014 0407   GLUCOSE 139* 11/21/2014 1636   BUN 25* 11/23/2014 0357   BUN 17 11/22/2014 0407   BUN 17 11/21/2014 1636   CREATININE 2.66* 11/23/2014 0357   CREATININE 1.98* 11/22/2014 0407   CREATININE 1.65*  11/21/2014 1636   CALCIUM 8.8 11/23/2014 0357   CALCIUM 8.8 11/22/2014 0407   CALCIUM 9.5 11/21/2014 1636   GFRNONAA 17* 11/23/2014 0357   GFRNONAA 25* 11/22/2014 0407   GFRNONAA 31* 11/21/2014 1636   GFRAA 20* 11/23/2014 0357   GFRAA 29* 11/22/2014 0407   GFRAA 36* 11/21/2014 1636   CBC    Component Value Date/Time   WBC 7.6 11/21/2014 1636   RBC 4.36 11/21/2014 1636   HGB 11.7* 11/21/2014 1636   HCT 36.9 11/21/2014 1636   PLT 289 11/21/2014 1636   MCV 84.6 11/21/2014 1636   MCH 26.8 11/21/2014 1636   MCHC 31.7 11/21/2014 1636   RDW 14.3 11/21/2014 1636   LYMPHSABS 2.8 11/21/2014 1636   MONOABS 0.7 11/21/2014 1636   EOSABS 0.0 11/21/2014 1636   BASOSABS 0.1 11/21/2014 1636   HEPATIC Function Panel  Recent Labs  11/21/14 1636  PROT 7.1   HEMOGLOBIN A1C No components found for: HGA1C,  MPG CARDIAC ENZYMES Lab Results  Component Value Date   TROPONINI 0.15* 11/22/2014   TROPONINI 0.17* 11/21/2014   TROPONINI 0.17* 11/21/2014   BNP No results for input(s): PROBNP in the last 8760 hours. TSH  Recent Labs  11/21/14 1635  TSH 2.452   CHOLESTEROL No results for input(s): CHOL in the last 8760 hours.  Scheduled Meds: . aspirin EC  81  mg Oral Daily  . carvedilol  3.125 mg Oral BID WC  . cloNIDine  0.2 mg Oral QHS  . fenofibrate  160 mg Oral Daily  . gabapentin  300 mg Oral BID  . heparin  5,000 Units Subcutaneous 3 times per day  . hydrOXYzine  25 mg Oral QID  . insulin aspart  0-15 Units Subcutaneous TID WC  . linagliptin  5 mg Oral Daily  . pantoprazole  40 mg Oral Daily  . potassium chloride SA  20 mEq Oral BID  . sodium chloride  3 mL Intravenous Q12H   Continuous Infusions: . sodium chloride 1,000 mL (11/22/14 1612)   PRN Meds:.sodium chloride, albuterol, ALPRAZolam, HYDROcodone-acetaminophen, ondansetron (ZOFRAN) IV, sodium chloride  Assessment/Plan: Acute left heart systolic failure DM, II Hypertension Obesity S/P knee surgery  Continue  medical treatment.    LOS: 2 days    Dixie Dials  MD  11/23/2014, 11:37 AM

## 2014-11-23 NOTE — Progress Notes (Signed)
Spoke with pharmacy r/t pt allergy with vicodin and stated she is allergic to it.  Dr.  Doree Albee here at this time made aware and asked pt about taking vicodin and she stated she has taken it before and no had no allergy RNX as well as  Granddaughter at bedside. Karie Kirks, Therapist, sports.

## 2014-11-23 NOTE — Progress Notes (Signed)
Pt requesting pain med for back pain.  Informed  Dr. Doylene Canard order for vicodin given.  Noted Pt has allergy to tylenol.  MD paged.  Karie Kirks, Therapist, sports.

## 2014-11-24 LAB — BASIC METABOLIC PANEL
Anion gap: 9 (ref 5–15)
BUN: 34 mg/dL — AB (ref 6–23)
CHLORIDE: 100 meq/L (ref 96–112)
CO2: 27 mmol/L (ref 19–32)
CREATININE: 3.79 mg/dL — AB (ref 0.50–1.10)
Calcium: 8.5 mg/dL (ref 8.4–10.5)
GFR calc Af Amer: 13 mL/min — ABNORMAL LOW (ref >90)
GFR calc non Af Amer: 11 mL/min — ABNORMAL LOW (ref >90)
GLUCOSE: 155 mg/dL — AB (ref 70–99)
POTASSIUM: 4.7 mmol/L (ref 3.5–5.1)
Sodium: 136 mmol/L (ref 135–145)

## 2014-11-24 LAB — MRSA PCR SCREENING: MRSA BY PCR: NEGATIVE

## 2014-11-24 LAB — GLUCOSE, CAPILLARY
GLUCOSE-CAPILLARY: 147 mg/dL — AB (ref 70–99)
GLUCOSE-CAPILLARY: 161 mg/dL — AB (ref 70–99)
GLUCOSE-CAPILLARY: 89 mg/dL (ref 70–99)
Glucose-Capillary: 152 mg/dL — ABNORMAL HIGH (ref 70–99)

## 2014-11-24 MED ORDER — SODIUM CHLORIDE 0.9 % IV SOLN
INTRAVENOUS | Status: DC
  Administered 2014-11-24: 14:00:00 via INTRAVENOUS

## 2014-11-24 MED ORDER — DOBUTAMINE IN D5W 4-5 MG/ML-% IV SOLN
2.5000 ug/kg/min | INTRAVENOUS | Status: DC
  Administered 2014-11-24: 5 ug/kg/min via INTRAVENOUS

## 2014-11-24 MED ORDER — MAGNESIUM SULFATE IN D5W 10-5 MG/ML-% IV SOLN
1.0000 g | Freq: Once | INTRAVENOUS | Status: AC
  Administered 2014-11-24: 1 g via INTRAVENOUS
  Filled 2014-11-24: qty 100

## 2014-11-24 MED ORDER — CLONIDINE HCL 0.1 MG PO TABS
0.1000 mg | ORAL_TABLET | Freq: Every day | ORAL | Status: DC
  Administered 2014-11-24 – 2014-11-26 (×3): 0.1 mg via ORAL
  Filled 2014-11-24 (×4): qty 1

## 2014-11-24 MED ORDER — DOBUTAMINE IN D5W 4-5 MG/ML-% IV SOLN
2.5000 ug/kg/min | INTRAVENOUS | Status: DC
  Filled 2014-11-24: qty 250

## 2014-11-24 MED ORDER — MAGNESIUM SULFATE 2 GM/50ML IV SOLN: 2.0000 g | Freq: Once | INTRAVENOUS | Status: DC

## 2014-11-24 MED ORDER — SODIUM CHLORIDE 0.9 % IV SOLN: 250.0000 mL | INTRAVENOUS | Status: DC | PRN

## 2014-11-24 NOTE — Progress Notes (Signed)
Spoke with Dr. Doylene Canard about increasing BP and frequency of PVCs. Order received to decrease dobutamine to 90mcg/kg/min. Will continue to monitor.

## 2014-11-25 ENCOUNTER — Inpatient Hospital Stay (HOSPITAL_COMMUNITY): Payer: Medicare Other

## 2014-11-25 LAB — BASIC METABOLIC PANEL
Anion gap: 9 (ref 5–15)
BUN: 33 mg/dL — AB (ref 6–23)
CALCIUM: 8.9 mg/dL (ref 8.4–10.5)
CO2: 25 mmol/L (ref 19–32)
Chloride: 103 mEq/L (ref 96–112)
Creatinine, Ser: 2.7 mg/dL — ABNORMAL HIGH (ref 0.50–1.10)
GFR calc Af Amer: 20 mL/min — ABNORMAL LOW (ref >90)
GFR, EST NON AFRICAN AMERICAN: 17 mL/min — AB (ref >90)
GLUCOSE: 146 mg/dL — AB (ref 70–99)
Potassium: 4.3 mmol/L (ref 3.5–5.1)
Sodium: 137 mmol/L (ref 135–145)

## 2014-11-25 LAB — URINALYSIS, ROUTINE W REFLEX MICROSCOPIC
BILIRUBIN URINE: NEGATIVE
Glucose, UA: NEGATIVE mg/dL
Hgb urine dipstick: NEGATIVE
Ketones, ur: NEGATIVE mg/dL
NITRITE: NEGATIVE
Protein, ur: 30 mg/dL — AB
SPECIFIC GRAVITY, URINE: 1.011 (ref 1.005–1.030)
UROBILINOGEN UA: 2 mg/dL — AB (ref 0.0–1.0)
pH: 6.5 (ref 5.0–8.0)

## 2014-11-25 LAB — GLUCOSE, CAPILLARY
GLUCOSE-CAPILLARY: 130 mg/dL — AB (ref 70–99)
Glucose-Capillary: 122 mg/dL — ABNORMAL HIGH (ref 70–99)
Glucose-Capillary: 149 mg/dL — ABNORMAL HIGH (ref 70–99)
Glucose-Capillary: 177 mg/dL — ABNORMAL HIGH (ref 70–99)

## 2014-11-25 LAB — URINE MICROSCOPIC-ADD ON

## 2014-11-25 MED ORDER — AMLODIPINE BESYLATE 5 MG PO TABS
5.0000 mg | ORAL_TABLET | Freq: Two times a day (BID) | ORAL | Status: DC
  Administered 2014-11-25 – 2014-11-27 (×5): 5 mg via ORAL
  Filled 2014-11-25 (×7): qty 1

## 2014-11-25 MED ORDER — SODIUM CHLORIDE 0.9 % IV SOLN: INTRAVENOUS | Status: DC

## 2014-11-25 MED ORDER — SODIUM CHLORIDE 0.9 % IV SOLN
INTRAVENOUS | Status: DC | PRN
  Administered 2014-11-25 – 2014-11-26 (×2): via INTRAVENOUS

## 2014-11-25 MED ORDER — SODIUM CHLORIDE 0.9 % IV SOLN: INTRAVENOUS | Status: AC

## 2014-11-25 MED ORDER — CETYLPYRIDINIUM CHLORIDE 0.05 % MT LIQD
7.0000 mL | Freq: Two times a day (BID) | OROMUCOSAL | Status: DC
  Administered 2014-11-25 – 2014-11-27 (×4): 7 mL via OROMUCOSAL

## 2014-11-25 NOTE — Progress Notes (Signed)
Ref: [name], MD   Subjective:  Feeling better. Breathing better. Improving creatinine.  Objective:  Vital Signs in the last 24 hours: Temp:  [97.8 F (36.6 C)-98 F (36.7 C)] 98 F (36.7 C) (12/27 0740) Pulse Rate:  [40-82] 40 (12/27 1000) Cardiac Rhythm:  [-] Normal sinus rhythm (12/27 0740) Resp:  [20] 20 (12/27 0522) BP: (105-185)/(44-144) 139/99 mmHg (12/27 1000) SpO2:  [91 %-100 %] 99 % (12/27 1000) Weight:  [93.2 kg (205 lb 7.5 oz)] 93.2 kg (205 lb 7.5 oz) (12/27 0500)  Physical Exam: BP Readings from Last 1 Encounters:  11/25/14 139/99    Wt Readings from Last 1 Encounters:  11/25/14 93.2 kg (205 lb 7.5 oz)    Weight change: 1.846 kg (4 lb 1.1 oz)  HEENT: [place]/AT, Eyes-Brown, PERL, EOMI, Conjunctiva-Pink, Sclera-Non-icteric Neck: No JVD, No bruit, Trachea midline. Lungs:  Clearing, Bilateral. Cardiac:  Regular rhythm, normal S1 and S2, no S3. II/VI systolic murmur. Abdomen:  Soft, non-tender. Extremities:  Trace edema present. No cyanosis. No clubbing. CNS: AxOx3, Cranial nerves grossly intact, moves all 4 extremities. Right handed. Skin: Warm and dry.   Intake/Output from previous day: 12/26 0701 - 12/27 0700 In: 689.7 [P.O.:360; I.V.:329.7] Out: 1050 [Urine:1050]    Lab Results: BMET    Component Value Date/Time   NA 137 11/25/2014 0822   NA 136 11/24/2014 0340   NA 139 11/23/2014 0357   K 4.3 11/25/2014 0822   K 4.7 11/24/2014 0340   K 4.0 11/23/2014 0357   CL 103 11/25/2014 0822   CL 100 11/24/2014 0340   CL 100 11/23/2014 0357   CO2 25 11/25/2014 0822   CO2 27 11/24/2014 0340   CO2 30 11/23/2014 0357   GLUCOSE 146* 11/25/2014 0822   GLUCOSE 155* 11/24/2014 0340   GLUCOSE 150* 11/23/2014 0357   BUN 33* 11/25/2014 0822   BUN 34* 11/24/2014 0340   BUN 25* 11/23/2014 0357   CREATININE 2.70* 11/25/2014 0822   CREATININE 3.79* 11/24/2014 0340   CREATININE 2.66* 11/23/2014 0357   CALCIUM 8.9 11/25/2014 0822   CALCIUM 8.5 11/24/2014 0340    CALCIUM 8.8 11/23/2014 0357   GFRNONAA 17* 11/25/2014 0822   GFRNONAA 11* 11/24/2014 0340   GFRNONAA 17* 11/23/2014 0357   GFRAA 20* 11/25/2014 0822   GFRAA 13* 11/24/2014 0340   GFRAA 20* 11/23/2014 0357   CBC    Component Value Date/Time   WBC 7.6 11/21/2014 1636   RBC 4.36 11/21/2014 1636   HGB 11.7* 11/21/2014 1636   HCT 36.9 11/21/2014 1636   PLT 289 11/21/2014 1636   MCV 84.6 11/21/2014 1636   MCH 26.8 11/21/2014 1636   MCHC 31.7 11/21/2014 1636   RDW 14.3 11/21/2014 1636   LYMPHSABS 2.8 11/21/2014 1636   MONOABS 0.7 11/21/2014 1636   EOSABS 0.0 11/21/2014 1636   BASOSABS 0.1 11/21/2014 1636   HEPATIC Function Panel  Recent Labs  11/21/14 1636  PROT 7.1   HEMOGLOBIN A1C No components found for: HGA1C,  MPG CARDIAC ENZYMES Lab Results  Component Value Date   TROPONINI 0.15* 11/22/2014   TROPONINI 0.17* 11/21/2014   TROPONINI 0.17* 11/21/2014   BNP No results for input(s): PROBNP in the last 8760 hours. TSH  Recent Labs  11/21/14 1635  TSH 2.452   CHOLESTEROL No results for input(s): CHOL in the last 8760 hours.  Scheduled Meds: . antiseptic oral rinse  7 mL Mouth Rinse BID  . aspirin EC  81 mg Oral Daily  . carvedilol  3.125 mg Oral BID WC  . cloNIDine  0.1 mg Oral QHS  . fenofibrate  160 mg Oral Daily  . gabapentin  300 mg Oral BID  . heparin  5,000 Units Subcutaneous 3 times per day  . hydrOXYzine  25 mg Oral QID  . insulin aspart  0-15 Units Subcutaneous TID WC  . linagliptin  5 mg Oral Daily  . pantoprazole  40 mg Oral Daily  . sodium chloride  3 mL Intravenous Q12H   Continuous Infusions: . DOBUTamine 2.5 mcg/kg/min (11/25/14 0740)   PRN Meds:.albuterol, ALPRAZolam, HYDROcodone-acetaminophen, ondansetron (ZOFRAN) IV, sodium chloride  Assessment/Plan: Acute left heart systolic failure DM, II Hypertension Obesity S/P knee surgery  Renal consult. Continue current treatment.    LOS: 4 days    Dixie Dials  MD  11/25/2014,  11:34 AM

## 2014-11-25 NOTE — Progress Notes (Signed)
Ref: [name], MD   Subjective:  Gained some weight. Increasing creatinine.  Objective:  Vital Signs in the last 24 hours: Temp:  [97.8 F (36.6 C)-99.1 F (37.3 C)] 97.8 F (36.6 C) (12/26 2000) Pulse Rate:  [45-81] 69 (12/27 0000) Cardiac Rhythm:  [-] Normal sinus rhythm (12/27 0000) Resp:  [17-20] 20 (12/27 0000) BP: (99-178)/(44-144) 139/81 mmHg (12/27 0000) SpO2:  [91 %-99 %] 98 % (12/27 0000) Weight:  [91.354 kg (201 lb 6.4 oz)] 91.354 kg (201 lb 6.4 oz) (12/26 0539)  Physical Exam: BP Readings from Last 1 Encounters:  11/25/14 139/81    Wt Readings from Last 1 Encounters:  11/24/14 91.354 kg (201 lb 6.4 oz)    Weight change:   HEENT: [place]/AT, Eyes-Brown, PERL, EOMI, Conjunctiva-Pink, Sclera-Non-icteric Neck: No JVD, No bruit, Trachea midline. Lungs:  Clearing, Bilateral. Cardiac:  Regular rhythm, normal S1 and S2, no S3.  Abdomen:  Soft, non-tender. Extremities:  Trace edema present. No cyanosis. No clubbing. CNS: AxOx3, Cranial nerves grossly intact, moves all 4 extremities. Right handed. Skin: Warm and dry.   Intake/Output from previous day: 12/26 0701 - 12/27 0700 In: 382.2 [P.O.:240; I.V.:142.2] Out: 700 [Urine:700]    Lab Results: BMET    Component Value Date/Time   NA 136 11/24/2014 0340   NA 139 11/23/2014 0357   NA 140 11/22/2014 0407   K 4.7 11/24/2014 0340   K 4.0 11/23/2014 0357   K 3.8 11/22/2014 0407   CL 100 11/24/2014 0340   CL 100 11/23/2014 0357   CL 104 11/22/2014 0407   CO2 27 11/24/2014 0340   CO2 30 11/23/2014 0357   CO2 27 11/22/2014 0407   GLUCOSE 155* 11/24/2014 0340   GLUCOSE 150* 11/23/2014 0357   GLUCOSE 118* 11/22/2014 0407   BUN 34* 11/24/2014 0340   BUN 25* 11/23/2014 0357   BUN 17 11/22/2014 0407   CREATININE 3.79* 11/24/2014 0340   CREATININE 2.66* 11/23/2014 0357   CREATININE 1.98* 11/22/2014 0407   CALCIUM 8.5 11/24/2014 0340   CALCIUM 8.8 11/23/2014 0357   CALCIUM 8.8 11/22/2014 0407   GFRNONAA 11*  11/24/2014 0340   GFRNONAA 17* 11/23/2014 0357   GFRNONAA 25* 11/22/2014 0407   GFRAA 13* 11/24/2014 0340   GFRAA 20* 11/23/2014 0357   GFRAA 29* 11/22/2014 0407   CBC    Component Value Date/Time   WBC 7.6 11/21/2014 1636   RBC 4.36 11/21/2014 1636   HGB 11.7* 11/21/2014 1636   HCT 36.9 11/21/2014 1636   PLT 289 11/21/2014 1636   MCV 84.6 11/21/2014 1636   MCH 26.8 11/21/2014 1636   MCHC 31.7 11/21/2014 1636   RDW 14.3 11/21/2014 1636   LYMPHSABS 2.8 11/21/2014 1636   MONOABS 0.7 11/21/2014 1636   EOSABS 0.0 11/21/2014 1636   BASOSABS 0.1 11/21/2014 1636   HEPATIC Function Panel  Recent Labs  11/21/14 1636  PROT 7.1   HEMOGLOBIN A1C No components found for: HGA1C,  MPG CARDIAC ENZYMES Lab Results  Component Value Date   TROPONINI 0.15* 11/22/2014   TROPONINI 0.17* 11/21/2014   TROPONINI 0.17* 11/21/2014   BNP No results for input(s): PROBNP in the last 8760 hours. TSH  Recent Labs  11/21/14 1635  TSH 2.452   CHOLESTEROL No results for input(s): CHOL in the last 8760 hours.  Scheduled Meds: . aspirin EC  81 mg Oral Daily  . carvedilol  3.125 mg Oral BID WC  . cloNIDine  0.1 mg Oral QHS  . fenofibrate  160  mg Oral Daily  . gabapentin  300 mg Oral BID  . heparin  5,000 Units Subcutaneous 3 times per day  . hydrOXYzine  25 mg Oral QID  . insulin aspart  0-15 Units Subcutaneous TID WC  . linagliptin  5 mg Oral Daily  . pantoprazole  40 mg Oral Daily  . sodium chloride  3 mL Intravenous Q12H   Continuous Infusions: . sodium chloride 10 mL/hr at 11/24/14 1428  . DOBUTamine 3 mcg/kg/min (11/24/14 2100)   PRN Meds:.albuterol, ALPRAZolam, HYDROcodone-acetaminophen, ondansetron (ZOFRAN) IV, sodium chloride  Assessment/Plan: Acute left heart systolic failure DM, II Hypertension Obesity S/P knee surgery  Consider inotropic support and hold lasix.    LOS: 3 days    Dixie Dials  MD  11/25/2014, 1:09 AM

## 2014-11-25 NOTE — Progress Notes (Signed)
Utilization Review Completed.  

## 2014-11-25 NOTE — Consult Note (Addendum)
Dana Walters is an 68 y.o. female referred by Dr Dana Walters   Chief Complaint: Acute on CKD 3 HPI: 68yo BF admitted 11/21/14 for SOB.  Has CKD according to Dr Dana Walters as Scr 1.6 on admission.  She underwent Ht cath on 12/24 and Scr increased to 3.79 yest but today it is lower at 2.7.  Pt unaware that has CKD.  No hx gross hematuria , stones or use of NSAID's.  HTN x 72yrs or more.  DM for ? Duration but < 2yrs.  Past Medical History  Diagnosis Date  . Hypertension   . Diabetes mellitus without complication   . Arthritis   Daughter with hx nephrotic syndrome, ? etiology  Past Surgical History  Procedure Laterality Date  . Achilles tendon surgery    . Knee surgery    . Left and right heart catheterization with coronary angiogram N/A 11/22/2014    Procedure: LEFT AND RIGHT HEART CATHETERIZATION WITH CORONARY ANGIOGRAM;  Surgeon: Dana Riddle, MD;  Location: [facility];  Service: Cardiovascular;  Laterality: N/A;    History reviewed. No pertinent family history. Social History:  reports that she has never smoked. Her smokeless tobacco use includes Snuff. She reports that she does not drink alcohol or use illicit drugs. Lives with daughter in [place]  Allergies:  Allergies  Allergen Reactions  . Percocet [Oxycodone-Acetaminophen] Itching  . Sular [Nisoldipine Er] Itching    Medications Prior to Admission  Medication Sig Dispense Refill  . BENICAR 40 MG tablet Take 40 mg by mouth daily.  3  . cloNIDine (CATAPRES) 0.2 MG tablet Take 0.2 mg by mouth 2 (two) times daily.  2  . fenofibrate (TRICOR) 145 MG tablet Take 145 mg by mouth daily.  3  . furosemide (LASIX) 40 MG tablet Take 40 mg by mouth daily.  3  . gabapentin (NEURONTIN) 300 MG capsule Take 300 mg by mouth 2 (two) times daily.  6  . JANUVIA 100 MG tablet Take 100 mg by mouth daily.    Marland Kitchen KLOR-CON M20 20 MEQ tablet Take 20 mEq by mouth 2 (two) times daily.  3  . metFORMIN (GLUCOPHAGE) 500 MG tablet Take 500 mg by mouth 2 (two) times  daily with a meal.    . metoprolol succinate (TOPROL-XL) 100 MG 24 hr tablet Take 100 mg by mouth daily.  3  . omeprazole (PRILOSEC) 20 MG capsule Take 20 mg by mouth daily.  2  . PROAIR HFA 108 (90 BASE) MCG/ACT inhaler Inhale 2 puffs into the lungs 4 (four) times daily as needed.  0  . acetaminophen-codeine (TYLENOL #3) 300-30 MG per tablet Take 1 tablet by mouth daily as needed.  0     Lab Results: UA: ND  No results for input(s): WBC, HGB, HCT, PLT in the last 72 hours. BMET  Recent Labs  11/23/14 0357 11/24/14 0340 11/25/14 0822  NA 139 136 137  K 4.0 4.7 4.3  CL 100 100 103  CO2 30 27 25   GLUCOSE 150* 155* 146*  BUN 25* 34* 33*  CREATININE 2.66* 3.79* 2.70*  CALCIUM 8.8 8.5 8.9   LFT No results for input(s): PROT, ALBUMIN, AST, ALT, ALKPHOS, BILITOT, BILIDIR, IBILI in the last 72 hours. No results found.  ROS: No change in vision No CP Breathing better No ABD pain No dysuria No neuropathic Sxs Occasional low back pain, chronic  PHYSICAL EXAM: Blood pressure 139/99, pulse 40, temperature 98 F (36.7 C), temperature source Oral, resp. rate 20,  height 5\' 2"  (1.575 m), weight 93.2 kg (205 lb 7.5 oz), SpO2 99 %. HEENT: PERRLA EOMI NECK:No JVD LUNGS: Clear CARDIAC:RRR w 2/6 systolic M at apex ABD:+ BS NTND No HSM.  No bruits EXT:No edema, No evidence of cholesterol emboli NEURO:CNI Ox3 no asterixis  Assessment: 1. Acute on CKD 3 sec contrast, renal fx improving and anticipate it to return to baseline 2. CKD 3 most likely sec HTN  +/- DM 3. HTN 4. DM 5. Cardiomyopathy, EF 20-25% PLAN: 1. Will check renal US, SPEP, UA, PTH for WU of CKD 2. Goal BP <130/80 3. Avoid NSAID's 4. Recheck renal fx in AM   Dana Walters T 11/25/2014, 11:31 AM

## 2014-11-26 LAB — GLUCOSE, CAPILLARY
GLUCOSE-CAPILLARY: 180 mg/dL — AB (ref 70–99)
Glucose-Capillary: 89 mg/dL (ref 70–99)
Glucose-Capillary: 154 mg/dL — ABNORMAL HIGH (ref 70–99)
Glucose-Capillary: 147 mg/dL — ABNORMAL HIGH (ref 70–99)

## 2014-11-26 LAB — RENAL FUNCTION PANEL
ANION GAP: 7 (ref 5–15)
Albumin: 2.6 g/dL — ABNORMAL LOW (ref 3.5–5.2)
BUN: 26 mg/dL — ABNORMAL HIGH (ref 6–23)
CHLORIDE: 104 meq/L (ref 96–112)
CO2: 26 mmol/L (ref 19–32)
Calcium: 8.8 mg/dL (ref 8.4–10.5)
Creatinine, Ser: 1.98 mg/dL — ABNORMAL HIGH (ref 0.50–1.10)
GFR calc non Af Amer: 25 mL/min — ABNORMAL LOW (ref >90)
GFR, EST AFRICAN AMERICAN: 29 mL/min — AB (ref >90)
GLUCOSE: 163 mg/dL — AB (ref 70–99)
POTASSIUM: 4.3 mmol/L (ref 3.5–5.1)
Phosphorus: 3.4 mg/dL (ref 2.3–4.6)
Sodium: 137 mmol/L (ref 135–145)

## 2014-11-26 LAB — URIC ACID: Uric Acid, Serum: 8 mg/dL — ABNORMAL HIGH (ref 2.4–7.0)

## 2014-11-26 MED ORDER — HYDROXYZINE HCL 25 MG PO TABS: 25.0000 mg | ORAL_TABLET | Freq: Four times a day (QID) | ORAL | Status: DC | PRN

## 2014-11-26 MED ORDER — GLIPIZIDE 5 MG PO TABS: 5.0000 mg | ORAL_TABLET | Freq: Two times a day (BID) | ORAL | Status: DC

## 2014-11-26 MED ORDER — ALBUTEROL SULFATE (2.5 MG/3ML) 0.083% IN NEBU
2.5000 mg | INHALATION_SOLUTION | Freq: Four times a day (QID) | RESPIRATORY_TRACT | Status: DC
Start: 2014-11-26 — End: 2014-11-27
  Administered 2014-11-26 – 2014-11-27 (×3): 2.5 mg via RESPIRATORY_TRACT
  Filled 2014-11-26 (×3): qty 3

## 2014-11-26 MED ORDER — GLIPIZIDE 5 MG PO TABS
2.5000 mg | ORAL_TABLET | Freq: Two times a day (BID) | ORAL | Status: DC
  Administered 2014-11-26 – 2014-11-27 (×2): 2.5 mg via ORAL
  Filled 2014-11-26 (×2): qty 1

## 2014-11-26 NOTE — Progress Notes (Signed)
S:feels better O:BP 132/78 mmHg  Pulse 83  Temp(Src) 98.6 F (37 C) (Oral)  Resp 21  Ht 5\' 2"  (1.575 m)  Wt 93.849 kg (206 lb 14.4 oz)  BMI 37.83 kg/m2  SpO2 98%  Intake/Output Summary (Last 24 hours) at 11/26/14 1157 Last data filed at 11/26/14 0800  Gross per 24 hour  Intake 1347.64 ml  Output   1900 ml  Net -552.36 ml   Intake/Output: I/O last 3 completed shifts: In: 1863.7 [P.O.:1380; I.V.:483.7] Out: 3050 [Urine:3050]  Intake/Output this shift:  Total I/O In: 120 [P.O.:120] Out: -  Weight change: -0.213 kg (-7.5 oz) Gen:WD WN AAF in NAD CVS:no rub Resp:cta [id number] Ext:no edema   Recent Labs Lab 11/21/14 1635 11/21/14 1636 11/22/14 0407 11/23/14 0357 11/24/14 0340 11/25/14 0822 11/26/14 0615  NA  --  140 140 139 136 137 137  K  --  4.0 3.8 4.0 4.7 4.3 4.3  CL  --  106 104 100 100 103 104  CO2  --  25 27 30 27 25 26   GLUCOSE  --  139* 118* 150* 155* 146* 163*  BUN  --  17 17 25* 34* 33* 26*  CREATININE 1.61* 1.65* 1.98* 2.66* 3.79* 2.70* 1.98*  ALBUMIN  --  3.5  --   --   --   --  2.6*  CALCIUM  --  9.5 8.8 8.8 8.5 8.9 8.8  PHOS  --   --   --   --   --   --  3.4  AST  --  28  --   --   --   --   --   ALT  --  23  --   --   --   --   --    Liver Function Tests:  Recent Labs Lab 11/21/14 1636 11/26/14 0615  AST 28  --   ALT 23  --   ALKPHOS 42  --   BILITOT 2.0*  --   PROT 7.1  --   ALBUMIN 3.5 2.6*   No results for input(s): LIPASE, AMYLASE in the last 168 hours. No results for input(s): AMMONIA in the last 168 hours. CBC:  Recent Labs Lab 11/21/14 1635 11/21/14 1636  WBC 7.7 7.6  NEUTROABS  --  3.9  HGB 11.7* 11.7*  HCT 37.2 36.9  MCV 84.5 84.6  PLT 307 289   Cardiac Enzymes:  Recent Labs Lab 11/21/14 1636 11/21/14 2020 11/22/14 0407  TROPONINI 0.17* 0.17* 0.15*   CBG:  Recent Labs Lab 11/25/14 1126 11/25/14 1653 11/25/14 2043 11/26/14 0736 11/26/14 1153  GLUCAP 130* 149* 177* 180* 89    Iron Studies: No  results for input(s): IRON, TIBC, TRANSFERRIN, FERRITIN in the last 72 hours. Studies/Results: US Renal  11/25/2014   CLINICAL DATA:  68 year old female with chronic kidney disease, hypertension, diabetes. Subsequent encounter.  EXAM: RENAL/URINARY TRACT ULTRASOUND COMPLETE  COMPARISON:  Lumbar MRI 06/07/2005.  FINDINGS: Right Kidney:  Length: 12.3 cm.  Mildly echogenic renal cortex.  No hydronephrosis.  Left Kidney:  Length: 11.2 cm. No hydronephrosis. Cortical echogenicity mildly increased or at the upper limits of normal.  Bladder:  Appears normal for degree of bladder distention.  IMPRESSION: No acute findings. Renal cortical echogenicity appears increased compatible with chronic medical renal disease.   Electronically Signed   By: Lars Pinks M.D.   On: 11/25/2014 16:19   . amLODipine  5 mg Oral Q12H  . antiseptic oral rinse  7 mL Mouth  Rinse BID  . aspirin EC  81 mg Oral Daily  . carvedilol  3.125 mg Oral BID WC  . cloNIDine  0.1 mg Oral QHS  . fenofibrate  160 mg Oral Daily  . gabapentin  300 mg Oral BID  . heparin  5,000 Units Subcutaneous 3 times per day  . hydrOXYzine  25 mg Oral QID  . insulin aspart  0-15 Units Subcutaneous TID WC  . linagliptin  5 mg Oral Daily  . pantoprazole  40 mg Oral Daily  . sodium chloride  3 mL Intravenous Q12H    BMET    Component Value Date/Time   NA 137 11/26/2014 0615   K 4.3 11/26/2014 0615   CL 104 11/26/2014 0615   CO2 26 11/26/2014 0615   GLUCOSE 163* 11/26/2014 0615   BUN 26* 11/26/2014 0615   CREATININE 1.98* 11/26/2014 0615   CALCIUM 8.8 11/26/2014 0615   GFRNONAA 25* 11/26/2014 0615   GFRAA 29* 11/26/2014 0615   CBC    Component Value Date/Time   WBC 7.6 11/21/2014 1636   RBC 4.36 11/21/2014 1636   HGB 11.7* 11/21/2014 1636   HCT 36.9 11/21/2014 1636   PLT 289 11/21/2014 1636   MCV 84.6 11/21/2014 1636   MCH 26.8 11/21/2014 1636   MCHC 31.7 11/21/2014 1636   RDW 14.3 11/21/2014 1636   LYMPHSABS 2.8 11/21/2014 1636    MONOABS 0.7 11/21/2014 1636   EOSABS 0.0 11/21/2014 1636   BASOSABS 0.1 11/21/2014 1636     Assessment/Plan:  1. AKI/CKD- due to contrast-induced nephropathy.  Trending toward her baseline Scr.  Underlying CKD stage 3 likely due to combo of DM and HTN.  Followed by Dr. Doylene Canard and will refer back to our practice if her renal function worsens 1. UPEP/SPEP pending 2. Renal US without hydro or masses 3. Goal BP <140/80, goal Hgb A1c <7%, avoidance of nephrotoxic agents such as NSAIDs/IV contrast 4. Ok to refer to our practice for follow up if her renal function does not return to baseline. 5. Will sign off please call with questions or concerns. 2. HTN- per Dr. Doylene Canard 3. SOB/SSCP- improved, no CAD 4. DM- per Dr. Doylene Canard 5. Dispo- per primary svc 6. CMP- EF 20%  Cammeron Greis A

## 2014-11-26 NOTE — Progress Notes (Signed)
Ref: [name], MD   Subjective:  Feeling better. Good urine output. Improving creatinine. Ultrasound of kidney was unremarkable. Uric acid 8 mg.  Objective:  Vital Signs in the last 24 hours: Temp:  [98.6 F (37 C)-98.8 F (37.1 C)] 98.6 F (37 C) (12/28 0602) Pulse Rate:  [79-91] 83 (12/28 0859) Cardiac Rhythm:  [-] Normal sinus rhythm (12/28 0400) Resp:  [18-25] 21 (12/28 0624) BP: (110-152)/(67-91) 132/78 mmHg (12/28 1000) SpO2:  [98 %-100 %] 99 % (12/28 1236) Weight:  [92.987 kg (205 lb)-93.849 kg (206 lb 14.4 oz)] 93.849 kg (206 lb 14.4 oz) (12/28 0845)  Physical Exam: BP Readings from Last 1 Encounters:  11/26/14 132/78    Wt Readings from Last 1 Encounters:  11/26/14 93.849 kg (206 lb 14.4 oz)    Weight change: -0.213 kg (-7.5 oz)  HEENT: [place]/AT, Eyes-Brown, PERL, EOMI, Conjunctiva-Pink, Sclera-Non-icteric Neck: No JVD, No bruit, Trachea midline. Lungs:  Clear, Bilateral. Cardiac:  Regular rhythm, normal S1 and S2, no S3. II/VI systolic murmur. Abdomen:  Soft, non-tender. Extremities:  Trace edema present. No cyanosis. No clubbing. CNS: AxOx3, Cranial nerves grossly intact, moves all 4 extremities. Right handed. Skin: Warm and dry.   Intake/Output from previous day: 12/27 0701 - 12/28 0700 In: 1491.6 [P.O.:1260; I.V.:231.6] Out: 2200 [Urine:2200]    Lab Results: BMET    Component Value Date/Time   NA 137 11/26/2014 0615   NA 137 11/25/2014 0822   NA 136 11/24/2014 0340   K 4.3 11/26/2014 0615   K 4.3 11/25/2014 0822   K 4.7 11/24/2014 0340   CL 104 11/26/2014 0615   CL 103 11/25/2014 0822   CL 100 11/24/2014 0340   CO2 26 11/26/2014 0615   CO2 25 11/25/2014 0822   CO2 27 11/24/2014 0340   GLUCOSE 163* 11/26/2014 0615   GLUCOSE 146* 11/25/2014 0822   GLUCOSE 155* 11/24/2014 0340   BUN 26* 11/26/2014 0615   BUN 33* 11/25/2014 0822   BUN 34* 11/24/2014 0340   CREATININE 1.98* 11/26/2014 0615   CREATININE 2.70* 11/25/2014 0822   CREATININE  3.79* 11/24/2014 0340   CALCIUM 8.8 11/26/2014 0615   CALCIUM 8.9 11/25/2014 0822   CALCIUM 8.5 11/24/2014 0340   GFRNONAA 25* 11/26/2014 0615   GFRNONAA 17* 11/25/2014 0822   GFRNONAA 11* 11/24/2014 0340   GFRAA 29* 11/26/2014 0615   GFRAA 20* 11/25/2014 0822   GFRAA 13* 11/24/2014 0340   CBC    Component Value Date/Time   WBC 7.6 11/21/2014 1636   RBC 4.36 11/21/2014 1636   HGB 11.7* 11/21/2014 1636   HCT 36.9 11/21/2014 1636   PLT 289 11/21/2014 1636   MCV 84.6 11/21/2014 1636   MCH 26.8 11/21/2014 1636   MCHC 31.7 11/21/2014 1636   RDW 14.3 11/21/2014 1636   LYMPHSABS 2.8 11/21/2014 1636   MONOABS 0.7 11/21/2014 1636   EOSABS 0.0 11/21/2014 1636   BASOSABS 0.1 11/21/2014 1636   HEPATIC Function Panel  Recent Labs  11/21/14 1636  PROT 7.1   HEMOGLOBIN A1C No components found for: HGA1C,  MPG CARDIAC ENZYMES Lab Results  Component Value Date   TROPONINI 0.15* 11/22/2014   TROPONINI 0.17* 11/21/2014   TROPONINI 0.17* 11/21/2014   BNP No results for input(s): PROBNP in the last 8760 hours. TSH  Recent Labs  11/21/14 1635  TSH 2.452   CHOLESTEROL No results for input(s): CHOL in the last 8760 hours.  Scheduled Meds: . albuterol  2.5 mg Nebulization Q6H  . amLODipine  5 mg Oral Q12H  . antiseptic oral rinse  7 mL Mouth Rinse BID  . aspirin EC  81 mg Oral Daily  . carvedilol  3.125 mg Oral BID WC  . cloNIDine  0.1 mg Oral QHS  . fenofibrate  160 mg Oral Daily  . gabapentin  300 mg Oral BID  . glipiZIDE  5 mg Oral BID AC  . heparin  5,000 Units Subcutaneous 3 times per day  . hydrOXYzine  25 mg Oral QID  . insulin aspart  0-15 Units Subcutaneous TID WC  . linagliptin  5 mg Oral Daily  . pantoprazole  40 mg Oral Daily  . sodium chloride  3 mL Intravenous Q12H   Continuous Infusions: . sodium chloride 7 mL/hr at 11/26/14 1007  . 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     LOS: 5 days    Dixie Dials  MD  11/26/2014, 1:38 PM

## 2014-11-27 LAB — RENAL FUNCTION PANEL
Albumin: 2.7 g/dL — ABNORMAL LOW (ref 3.5–5.2)
Anion gap: 10 (ref 5–15)
BUN: 22 mg/dL (ref 6–23)
CHLORIDE: 104 meq/L (ref 96–112)
CO2: 23 mmol/L (ref 19–32)
Calcium: 8.9 mg/dL (ref 8.4–10.5)
Creatinine, Ser: 1.71 mg/dL — ABNORMAL HIGH (ref 0.50–1.10)
GFR calc Af Amer: 34 mL/min — ABNORMAL LOW (ref >90)
GFR, EST NON AFRICAN AMERICAN: 30 mL/min — AB (ref >90)
Glucose, Bld: 100 mg/dL — ABNORMAL HIGH (ref 70–99)
Phosphorus: 2.9 mg/dL (ref 2.3–4.6)
Potassium: 4.1 mmol/L (ref 3.5–5.1)
Sodium: 137 mmol/L (ref 135–145)

## 2014-11-27 LAB — PROTEIN ELECTROPHORESIS, SERUM
ALBUMIN ELP: 44.9 % — AB (ref 55.8–66.1)
Alpha-1-Globulin: 6.9 % — ABNORMAL HIGH (ref 2.9–4.9)
Alpha-2-Globulin: 16 % — ABNORMAL HIGH (ref 7.1–11.8)
Beta 2: 7.1 % — ABNORMAL HIGH (ref 3.2–6.5)
Beta Globulin: 6.4 % (ref 4.7–7.2)
GAMMA GLOBULIN: 18.7 % (ref 11.1–18.8)
M-Spike, %: NOT DETECTED g/dL
Total Protein ELP: 5.8 g/dL — ABNORMAL LOW (ref 6.0–8.3)

## 2014-11-27 LAB — GLUCOSE, CAPILLARY
GLUCOSE-CAPILLARY: 94 mg/dL (ref 70–99)
Glucose-Capillary: 155 mg/dL — ABNORMAL HIGH (ref 70–99)

## 2014-11-27 LAB — PARATHYROID HORMONE, INTACT (NO CA): PTH: 95 pg/mL — AB (ref 14–64)

## 2014-11-27 MED ORDER — AMLODIPINE BESYLATE 2.5 MG PO TABS: 5.0000 mg | ORAL_TABLET | Freq: Every day | ORAL | Status: DC

## 2014-11-27 MED ORDER — FUROSEMIDE 40 MG PO TABS
40.0000 mg | ORAL_TABLET | ORAL | Status: DC
Start: 2014-11-27 — End: 2021-12-26

## 2014-11-27 MED ORDER — ALLOPURINOL 100 MG PO TABS: 100.0000 mg | ORAL_TABLET | Freq: Every day | ORAL | Status: AC

## 2014-11-27 MED ORDER — POTASSIUM CHLORIDE CRYS ER 10 MEQ PO TBCR: 10.0000 meq | EXTENDED_RELEASE_TABLET | ORAL | Status: DC

## 2014-11-27 MED ORDER — GLIPIZIDE 2.5 MG HALF TABLET
2.5000 mg | ORAL_TABLET | Freq: Every day | ORAL | Status: DC
Start: 2014-11-27 — End: 2021-09-11

## 2014-11-27 MED ORDER — CLONIDINE HCL 0.1 MG PO TABS: 0.1000 mg | ORAL_TABLET | Freq: Every day | ORAL | Status: DC

## 2014-11-27 MED ORDER — CARVEDILOL 6.25 MG PO TABS: 6.2500 mg | ORAL_TABLET | Freq: Two times a day (BID) | ORAL | Status: AC

## 2014-11-27 NOTE — Discharge Summary (Signed)
Physician Discharge Summary  Patient ID: Dana Walters MRN: [id number] DOB/AGE: 1946-04-21 68 y.o.  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 No fever. Chest x-ray is suggestive of CHF. Past history of hypertension, obesity and diabetes, type II. She was treated with IV lasix. She then underwent right and left heart catheterization showing mildly elevated right heart systolic pressures and normal coronaries. She developed contrast used acute kidney injury with creatinine increased to 3.79 mg/dl slowly returning to 1.71 with fluids and dobutamine drip. Nephrology consult was obtained. Ultrasound of kidney showed increased renal cortical echogenicity compatible with chronic medical renal disease. She was discharged home in stable condition with instructions on daily weight, decreasing weight, decreasing salt and fluid intake, increasing walking and taking medications regularly to control blood pressure and diabetes.  Consults: cardiology and nephrology  Significant Diagnostic Studies: labs: Normal electrolytes with elevated blood sugar of100 to 163 mg/dL and BUN/Cr of 22/1.71 to 34/3.79.  Chest X-ray: Mild CHF.  Renal ultrasound: increased renal cortical echogenicity compatible with chronic  medical renal disease.  Echocardiogram: Left ventricle: The cavity size was mildly dilated. There was mild concentric hypertrophy. Systolic function was severely reduced. The estimated ejection fraction was in the range of 20% to 25%. Diffuse hypokinesis. Doppler parameters are consistent with abnormal left ventricular relaxation (grade 1 diastolic dysfunction). - Aortic valve: There was trivial regurgitation. - Mitral valve: There was severe regurgitation. - Right ventricle: Systolic function was mildly reduced. - Right atrium: The atrium was mildly dilated. - Tricuspid valve: There was severe regurgitation. - Pulmonary arteries: Systolic pressure was mildly increased. PA peak pressure: 45 mm Hg (S).  R + L heart cath: Normal coronaries with mildly increased right heart pressures.  Treatments: cardiac meds: carvedilol, amlodipine and furosemide.  Discharge Exam: Blood pressure 116/73, pulse 89, temperature 98.3 F (36.8 C), temperature source Oral, resp. rate 20, height 5\' 2"  (1.575 m), weight 94.031 kg (207 lb 4.8 oz), SpO2 100 %. HEENT: [place]/AT, Eyes-Brown, PERL, EOMI, Conjunctiva-Pink, Sclera-Non-icteric Neck: No JVD, No bruit, Trachea midline. Lungs: Clear, Bilateral. Cardiac: Regular rhythm, normal S1 and S2, no S3. II/VI systolic murmur. Abdomen: Soft, non-tender. Extremities: No edema present. No cyanosis. No clubbing. CNS: AxOx2, Cranial nerves grossly intact, moves all 4 extremities. Right handed. Skin: Warm and dry.  Disposition: 01-Home or Self Care     Medication List    STOP taking these medications        acetaminophen-codeine 300-30 MG per tablet  Commonly known as:  TYLENOL #3     BENICAR 40 MG tablet  Generic drug:  olmesartan     hydrOXYzine 25 MG tablet  Commonly known as:  ATARAX/VISTARIL     metFORMIN 500 MG tablet  Commonly known as:  GLUCOPHAGE     metoprolol succinate 100 MG 24 hr tablet  Commonly known as:  TOPROL-XL  TAKE these  medications        allopurinol 100 MG tablet  Commonly known as:  ZYLOPRIM  Take 1 tablet (100 mg total) by mouth daily.     amLODipine 2.5 MG tablet  Commonly known as:  NORVASC  Take 2 tablets (5 mg total) by mouth daily.     carvedilol 6.25 MG tablet  Commonly known as:  COREG  Take 1 tablet (6.25 mg total) by mouth 2 (two) times daily with a meal.     cloNIDine 0.1 MG tablet  Commonly known as:  CATAPRES  Take 1 tablet (0.1 mg total) by mouth at bedtime.     fenofibrate 145 MG tablet  Commonly known as:  TRICOR  Take 145 mg by mouth daily.     furosemide 40 MG tablet  Commonly known as:  LASIX  Take 1 tablet (40 mg total) by mouth every Monday, Wednesday, and Friday.     gabapentin 300 MG capsule  Commonly known as:  NEURONTIN  Take 300 mg by mouth 2 (two) times daily.     glipiZIDE 2.5 mg Tabs tablet  Commonly known as:  GLUCOTROL  Take 0.5 tablets (2.5 mg total) by mouth daily before breakfast.     JANUVIA 100 MG tablet  Generic drug:  sitaGLIPtin  Take 100 mg by mouth daily.     omeprazole 20 MG capsule  Commonly known as:  PRILOSEC  Take 20 mg by mouth daily.     potassium chloride 10 MEQ tablet  Commonly known as:  KLOR-CON M20  Take 1 tablet (10 mEq total) by mouth every Monday, Wednesday, and Friday.     PROAIR HFA 108 (90 BASE) MCG/ACT inhaler  Generic drug:  albuterol  Inhale 2 puffs into the lungs 4 (four) times daily as needed.           Follow-up Information    Follow up with [facility] S, MD. Schedule an appointment as soon as possible for a visit in 1 month.   Specialty:  Cardiology   Contact information:   [place] [address] [phone]       Signed: [name] 11/27/2014, 9:12 AM

## 2014-11-27 NOTE — Evaluation (Signed)
Physical Therapy Evaluation and D/C Patient Details Name: Dana Walters MRN: [id number] DOB: October 16, 1946 Today's Date: 11/27/2014   History of Present Illness  Pt admit for CHF.   Clinical Impression  Pt admitted with above diagnosis. Pt currently without functional limitations and is Modif I with mobility.  Pt states she has been using crutches for some time and will continue to use them as they make her feel safe.  Pt will benefit from skilled PT in home to assess safety in the home but no further hospital needs.  Sign off.      Follow Up Recommendations Home health PT (safety eval)    Equipment Recommendations  None recommended by PT    Recommendations for Other Services       Precautions / Restrictions Precautions Precautions: Fall Restrictions Weight Bearing Restrictions: No      Mobility  Bed Mobility Overal bed mobility: Independent                Transfers Overall transfer level: Independent                  Ambulation/Gait Ambulation/Gait assistance: Modified independent (Device/Increase time) Ambulation Distance (Feet): 650 Feet Assistive device: Crutches Gait Pattern/deviations: Step-through pattern;Antalgic;Wide base of support   Gait velocity interpretation: <1.8 ft/sec, indicative of risk for recurrent falls General Gait Details: Pt safe with crutches.  No LOB with challenges to balance.    Stairs Stairs: Yes Stairs assistance: Modified independent (Device/Increase time) Stair Management: With crutches;Step to pattern;Forwards Number of Stairs: 9 General stair comments: No difficulty on steps.  Good technique  Wheelchair Mobility    Modified Rankin (Stroke Patients Only)       Balance Overall balance assessment: Modified Independent;No apparent balance deficits (not formally assessed)                                           Pertinent Vitals/Pain Pain Assessment: No/denies pain  VSS    Home Living  Family/patient expects to be discharged to:: Private residence Living Arrangements: Children Available Help at Discharge: Family;Available 24 hours/day Type of Home: House Home Access: Stairs to enter Entrance Stairs-Rails: None Entrance Stairs-Number of Steps: 9 Home Layout: One level Home Equipment: Crutches      Prior Function Level of Independence: Independent with assistive device(s)         Comments: used crutches PTA     Hand Dominance        Extremity/Trunk Assessment   Upper Extremity Assessment: Defer to OT evaluation           Lower Extremity Assessment: Overall WFL for tasks assessed      Cervical / Trunk Assessment: Normal  Communication   Communication: No difficulties  Cognition Arousal/Alertness: Awake/alert Behavior During Therapy: WFL for tasks assessed/performed Overall Cognitive Status: Within Functional Limits for tasks assessed                      General Comments      Exercises        Assessment/Plan    PT Assessment Patent does not need any further PT services;All further PT needs can be met in the next venue of care  PT Diagnosis Generalized weakness   PT Problem List Other (comment) (safety in home)  PT Treatment Interventions     PT Goals (Current goals can be found in the  Care Plan section) Acute Rehab PT Goals PT Goal Formulation: All assessment and education complete, DC therapy    Frequency     Barriers to discharge        Co-evaluation               End of Session Equipment Utilized During Treatment: Gait belt Activity Tolerance: Patient limited by fatigue Patient left: in bed;with call bell/phone within reach Nurse Communication: Mobility status         Time: 0950-1004 PT Time Calculation (min) (ACUTE ONLY): 14 min   Charges:   PT Evaluation $Initial PT Evaluation Tier I: 1 Procedure PT Treatments $Gait Training: 8-22 mins   PT G CodesDenice Paradise 2014/12/27, 10:23  AM [facility] [phone] (pager)

## 2014-11-27 NOTE — Discharge Summary (Signed)
Discharge instructions reviewed with patient and daughter at bedside. Questions answered. Aware of medications stopped, changed, and added. Daughter states that [facility] where prescriptions [facility] in is acceptable for pickup. Also aware that home health to visit patient within next 24-48 hours. AVS givent to patient. IV and telemetry discontinued. Daughter to call taxi for transportation home. Daughter to call when patient is ready to be taken downstairs via wheelchair.

## 2014-11-27 NOTE — Plan of Care (Signed)
Problem: Discharge Progression Outcomes Goal: If EF < 40% ACEI/ARB addressed at discharge Outcome: Completed/Met Date Met:  11/27/14 No ACEI/ARB ordered r/t kidney function per Dr. Doylene Canard

## 2014-11-27 NOTE — Progress Notes (Signed)
Dr. Doylene Canard notified of HR in 70s with frequent PVCs, bigeminy and trigeminy. MD states this is patient's baseline and to proceed with discharge.

## 2015-01-07 ENCOUNTER — Other Ambulatory Visit (INDEPENDENT_AMBULATORY_CARE_PROVIDER_SITE_OTHER): Payer: Self-pay | Admitting: Surgery

## 2015-05-22 ENCOUNTER — Other Ambulatory Visit: Payer: Self-pay | Admitting: Cardiovascular Disease

## 2015-05-22 ENCOUNTER — Ambulatory Visit
Admission: RE | Admit: 2015-05-22 | Discharge: 2015-05-22 | Disposition: A | Discharge status: Home / Self Care | Payer: Medicare Other | Source: Ambulatory Visit | Attending: Cardiovascular Disease | Admitting: Cardiovascular Disease

## 2015-05-22 DIAGNOSIS — R1084 Generalized abdominal pain: Secondary | ICD-10-CM

## 2015-05-24 ENCOUNTER — Ambulatory Visit
Admission: RE | Admit: 2015-05-24 | Discharge: 2015-05-24 | Disposition: A | Discharge status: Home / Self Care | Payer: Medicare Other | Source: Ambulatory Visit | Attending: Cardiovascular Disease | Admitting: Cardiovascular Disease

## 2015-05-24 DIAGNOSIS — R1084 Generalized abdominal pain: Secondary | ICD-10-CM

## 2015-05-28 ENCOUNTER — Other Ambulatory Visit: Payer: Self-pay | Admitting: Cardiovascular Disease

## 2015-05-28 DIAGNOSIS — K8689 Other specified diseases of pancreas: Secondary | ICD-10-CM

## 2015-05-28 DIAGNOSIS — R1084 Generalized abdominal pain: Secondary | ICD-10-CM

## 2015-05-30 ENCOUNTER — Ambulatory Visit
Admission: RE | Admit: 2015-05-30 | Discharge: 2015-05-30 | Disposition: A | Discharge status: Home / Self Care | Payer: Medicare Other | Source: Ambulatory Visit | Attending: Cardiovascular Disease | Admitting: Cardiovascular Disease

## 2015-05-30 DIAGNOSIS — K8689 Other specified diseases of pancreas: Secondary | ICD-10-CM

## 2015-05-30 DIAGNOSIS — R1084 Generalized abdominal pain: Secondary | ICD-10-CM

## 2015-05-30 MED ORDER — IOPAMIDOL (ISOVUE-300) INJECTION 61%
90.0000 mL | Freq: Once | INTRAVENOUS | Status: AC | PRN
  Administered 2015-05-30: 90 mL via INTRAVENOUS

## 2015-06-07 ENCOUNTER — Other Ambulatory Visit: Payer: Self-pay | Admitting: Cardiovascular Disease

## 2015-06-07 DIAGNOSIS — R1084 Generalized abdominal pain: Secondary | ICD-10-CM

## 2015-06-07 DIAGNOSIS — E278 Other specified disorders of adrenal gland: Secondary | ICD-10-CM

## 2015-06-07 DIAGNOSIS — N2889 Other specified disorders of kidney and ureter: Secondary | ICD-10-CM

## 2015-06-24 ENCOUNTER — Ambulatory Visit
Admission: RE | Admit: 2015-06-24 | Discharge: 2015-06-24 | Disposition: A | Discharge status: Home / Self Care | Payer: Medicare Other | Source: Ambulatory Visit | Attending: Cardiovascular Disease | Admitting: Cardiovascular Disease

## 2015-06-24 DIAGNOSIS — N2889 Other specified disorders of kidney and ureter: Secondary | ICD-10-CM

## 2015-06-24 DIAGNOSIS — E278 Other specified disorders of adrenal gland: Secondary | ICD-10-CM

## 2015-06-24 DIAGNOSIS — R1084 Generalized abdominal pain: Secondary | ICD-10-CM

## 2015-06-24 MED ORDER — GADOBENATE DIMEGLUMINE 529 MG/ML IV SOLN
10.0000 mL | Freq: Once | INTRAVENOUS | Status: AC | PRN
  Administered 2015-06-24: 10 mL via INTRAVENOUS

## 2015-10-21 ENCOUNTER — Other Ambulatory Visit (HOSPITAL_COMMUNITY): Payer: Self-pay | Admitting: Gastroenterology

## 2015-10-21 ENCOUNTER — Other Ambulatory Visit: Payer: Self-pay | Admitting: Gastroenterology

## 2015-10-21 DIAGNOSIS — R140 Abdominal distension (gaseous): Secondary | ICD-10-CM

## 2015-11-04 ENCOUNTER — Ambulatory Visit (HOSPITAL_COMMUNITY)
Admission: RE | Admit: 2015-11-04 | Discharge: 2015-11-04 | Disposition: A | Discharge status: Home / Self Care | Payer: Medicare Other | Source: Ambulatory Visit | Attending: Gastroenterology | Admitting: Gastroenterology

## 2015-11-04 DIAGNOSIS — R6881 Early satiety: Secondary | ICD-10-CM | POA: Insufficient documentation

## 2015-11-04 DIAGNOSIS — R140 Abdominal distension (gaseous): Secondary | ICD-10-CM | POA: Diagnosis present

## 2015-11-04 MED ORDER — TECHNETIUM TC 99M SULFUR COLLOID
2.0000 | Freq: Once | INTRAVENOUS | Status: AC | PRN
  Administered 2015-11-04: 2 via ORAL

## 2015-11-06 ENCOUNTER — Other Ambulatory Visit: Payer: Self-pay | Admitting: Gastroenterology

## 2015-11-06 ENCOUNTER — Ambulatory Visit
Admission: RE | Admit: 2015-11-06 | Discharge: 2015-11-06 | Disposition: A | Discharge status: Home / Self Care | Payer: Medicare Other | Source: Ambulatory Visit | Attending: Gastroenterology | Admitting: Gastroenterology

## 2015-11-06 DIAGNOSIS — R140 Abdominal distension (gaseous): Secondary | ICD-10-CM

## 2016-04-10 ENCOUNTER — Other Ambulatory Visit: Payer: Self-pay | Admitting: Surgery

## 2016-07-08 ENCOUNTER — Other Ambulatory Visit: Payer: Self-pay | Admitting: Cardiovascular Disease

## 2016-07-08 DIAGNOSIS — N289 Disorder of kidney and ureter, unspecified: Secondary | ICD-10-CM

## 2016-07-23 ENCOUNTER — Inpatient Hospital Stay: Admission: RE | Admit: 2016-07-23 | Payer: Medicare Other | Source: Ambulatory Visit

## 2016-08-07 ENCOUNTER — Inpatient Hospital Stay: Admission: RE | Admit: 2016-08-07 | Payer: Medicare Other | Source: Ambulatory Visit

## 2016-09-04 ENCOUNTER — Ambulatory Visit
Admission: RE | Admit: 2016-09-04 | Discharge: 2016-09-04 | Disposition: A | Discharge status: Home / Self Care | Payer: Medicare Other | Source: Ambulatory Visit | Attending: Cardiovascular Disease | Admitting: Cardiovascular Disease

## 2016-09-04 DIAGNOSIS — N289 Disorder of kidney and ureter, unspecified: Secondary | ICD-10-CM

## 2016-09-04 MED ORDER — GADOBENATE DIMEGLUMINE 529 MG/ML IV SOLN
10.0000 mL | Freq: Once | INTRAVENOUS | Status: AC | PRN
  Administered 2016-09-04: 10 mL via INTRAVENOUS

## 2016-11-07 IMAGING — NM NM GASTRIC EMPTYING
2 series · 2 of 2 positions shown · non-contrast
Comparison: None.

CLINICAL DATA: Early satiety.  Bloating.

EXAM:
NUCLEAR MEDICINE GASTRIC EMPTYING SCAN
TECHNIQUE: After oral ingestion of radiolabeled meal, sequential abdominal
images were obtained for 120 minutes. Residual percentage of
activity remaining within the stomach was calculated at 60 and 120
minutes.
RADIOPHARMACEUTICALS:  2.0 mCi Jc-33m MDP labeled sulfur colloid
orally

[Series 1: 0 min · 4.14mm/px · 1 of 1 slices shown]
[im 1/1]
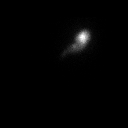

[Series 1: 1 hr · 4.14mm/px · 1 of 1 slices shown]
[im 1/1]
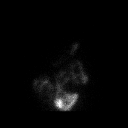

[2 of 2 positions shown; findings below may reference images not displayed]

FINDINGS: Expected location of the stomach in the left upper quadrant.
Ingested meal empties the stomach gradually over the course of the
study with 32.3% retention at 60 min and 7.7% retention at 80 min
(normal retention less than 30% at a 120 min).
IMPRESSION: Normal gastric emptying study.

## 2017-05-05 ENCOUNTER — Encounter (INDEPENDENT_AMBULATORY_CARE_PROVIDER_SITE_OTHER): Payer: Self-pay | Admitting: Ophthalmology

## 2017-05-13 ENCOUNTER — Encounter (INDEPENDENT_AMBULATORY_CARE_PROVIDER_SITE_OTHER): Payer: Medicare Other | Admitting: Ophthalmology

## 2017-06-11 ENCOUNTER — Encounter (INDEPENDENT_AMBULATORY_CARE_PROVIDER_SITE_OTHER): Payer: Medicare Other | Admitting: Ophthalmology

## 2017-06-11 DIAGNOSIS — H35033 Hypertensive retinopathy, bilateral: Secondary | ICD-10-CM

## 2017-06-11 DIAGNOSIS — I10 Essential (primary) hypertension: Secondary | ICD-10-CM

## 2017-06-11 DIAGNOSIS — H33302 Unspecified retinal break, left eye: Secondary | ICD-10-CM | POA: Diagnosis not present

## 2017-06-11 DIAGNOSIS — H43813 Vitreous degeneration, bilateral: Secondary | ICD-10-CM

## 2017-06-23 ENCOUNTER — Encounter (INDEPENDENT_AMBULATORY_CARE_PROVIDER_SITE_OTHER): Payer: Medicare Other | Admitting: Ophthalmology

## 2017-06-23 DIAGNOSIS — H33302 Unspecified retinal break, left eye: Secondary | ICD-10-CM

## 2017-10-07 ENCOUNTER — Ambulatory Visit
Admission: RE | Admit: 2017-10-07 | Discharge: 2017-10-07 | Disposition: A | Discharge status: Home / Self Care | Payer: Medicare Other | Source: Ambulatory Visit | Attending: Cardiovascular Disease | Admitting: Cardiovascular Disease

## 2017-10-07 ENCOUNTER — Other Ambulatory Visit: Payer: Self-pay | Admitting: Cardiovascular Disease

## 2017-10-07 DIAGNOSIS — R059 Cough, unspecified: Secondary | ICD-10-CM

## 2017-10-07 DIAGNOSIS — R05 Cough: Secondary | ICD-10-CM

## 2017-10-25 ENCOUNTER — Encounter (INDEPENDENT_AMBULATORY_CARE_PROVIDER_SITE_OTHER): Payer: Medicare Other | Admitting: Ophthalmology

## 2018-10-17 ENCOUNTER — Other Ambulatory Visit: Payer: Self-pay | Admitting: Cardiovascular Disease

## 2018-10-17 ENCOUNTER — Ambulatory Visit
Admission: RE | Admit: 2018-10-17 | Discharge: 2018-10-17 | Disposition: A | Discharge status: Home / Self Care | Payer: Medicare Other | Source: Ambulatory Visit | Attending: Cardiovascular Disease | Admitting: Cardiovascular Disease

## 2018-10-17 DIAGNOSIS — M549 Dorsalgia, unspecified: Secondary | ICD-10-CM

## 2018-10-17 DIAGNOSIS — M25562 Pain in left knee: Secondary | ICD-10-CM

## 2019-01-20 ENCOUNTER — Other Ambulatory Visit: Payer: Self-pay | Admitting: Cardiovascular Disease

## 2019-01-20 DIAGNOSIS — M519 Unspecified thoracic, thoracolumbar and lumbosacral intervertebral disc disorder: Secondary | ICD-10-CM

## 2019-01-29 ENCOUNTER — Ambulatory Visit
Admission: RE | Admit: 2019-01-29 | Discharge: 2019-01-29 | Disposition: A | Discharge status: Home / Self Care | Payer: Medicare HMO | Source: Ambulatory Visit | Attending: Cardiovascular Disease | Admitting: Cardiovascular Disease

## 2019-01-29 DIAGNOSIS — M519 Unspecified thoracic, thoracolumbar and lumbosacral intervertebral disc disorder: Secondary | ICD-10-CM

## 2019-10-03 ENCOUNTER — Other Ambulatory Visit: Payer: Self-pay | Admitting: Cardiovascular Disease

## 2019-10-03 DIAGNOSIS — Z1231 Encounter for screening mammogram for malignant neoplasm of breast: Secondary | ICD-10-CM

## 2019-11-27 ENCOUNTER — Other Ambulatory Visit: Payer: Self-pay

## 2019-11-27 ENCOUNTER — Ambulatory Visit
Admission: RE | Admit: 2019-11-27 | Discharge: 2019-11-27 | Disposition: A | Discharge status: Home / Self Care | Payer: Medicare HMO | Source: Ambulatory Visit | Attending: Cardiovascular Disease | Admitting: Cardiovascular Disease

## 2019-11-27 DIAGNOSIS — Z1231 Encounter for screening mammogram for malignant neoplasm of breast: Secondary | ICD-10-CM

## 2020-01-29 ENCOUNTER — Ambulatory Visit: Payer: Medicare HMO | Attending: Internal Medicine

## 2020-01-29 DIAGNOSIS — Z23 Encounter for immunization: Secondary | ICD-10-CM

## 2020-01-29 NOTE — Progress Notes (Signed)
   Covid-19 Vaccination Clinic  Name:  Dana Walters    MRN: [id number] DOB: [birth year]  01/29/2020  Dana Walters was observed post Covid-19 immunization for 30 minutes based on pre-vaccination screening without incidence. She was provided with Vaccine Information Sheet and instruction to access the V-Safe system.   Dana Walters was instructed to call 911 with any severe reactions post vaccine: Marland Kitchen Difficulty breathing  . Swelling of your face and throat  . A fast heartbeat  . A bad rash all over your body  . Dizziness and weakness    Immunizations Administered    Name Date Dose VIS Date Route   Pfizer COVID-19 Vaccine 01/29/2020  8:26 AM 0.3 mL 11/10/2019 Intramuscular   Manufacturer: [place]   Lot: [id number]   [place]: [id number]

## 2020-02-27 ENCOUNTER — Ambulatory Visit: Payer: Medicare HMO | Attending: Internal Medicine

## 2020-02-27 DIAGNOSIS — Z23 Encounter for immunization: Secondary | ICD-10-CM

## 2020-02-27 NOTE — Progress Notes (Signed)
   Covid-19 Vaccination Clinic  Name:  JAANA BRODT    MRN: [id number] DOB: 25-Jun-1946  02/27/2020  Ms. Kuehl was observed post Covid-19 immunization for 15 minutes without incident. She was provided with Vaccine Information Sheet and instruction to access the V-Safe system.   Ms. Parzych was instructed to call 911 with any severe reactions post vaccine: Marland Kitchen Difficulty breathing  . Swelling of face and throat  . A fast heartbeat  . A bad rash all over body  . Dizziness and weakness   Immunizations Administered    Name Date Dose VIS Date Route   Pfizer COVID-19 Vaccine 02/27/2020  9:30 AM 0.3 mL 11/10/2019 Intramuscular   Manufacturer: [place]   Lot: [id number]   [place]: [id number]

## 2021-09-07 ENCOUNTER — Emergency Department (HOSPITAL_COMMUNITY): Payer: Medicare HMO

## 2021-09-07 ENCOUNTER — Inpatient Hospital Stay (HOSPITAL_COMMUNITY)
Admission: EM | Admit: 2021-09-07 | Discharge: 2021-09-11 | DRG: 291 | Disposition: A | Discharge status: Home / Self Care | Payer: Medicare HMO | Attending: Cardiovascular Disease | Admitting: Cardiovascular Disease

## 2021-09-07 ENCOUNTER — Other Ambulatory Visit: Payer: Self-pay

## 2021-09-07 DIAGNOSIS — E785 Hyperlipidemia, unspecified: Secondary | ICD-10-CM | POA: Diagnosis present

## 2021-09-07 DIAGNOSIS — R0902 Hypoxemia: Secondary | ICD-10-CM | POA: Diagnosis present

## 2021-09-07 DIAGNOSIS — R7989 Other specified abnormal findings of blood chemistry: Secondary | ICD-10-CM | POA: Diagnosis present

## 2021-09-07 DIAGNOSIS — F17290 Nicotine dependence, other tobacco product, uncomplicated: Secondary | ICD-10-CM | POA: Diagnosis present

## 2021-09-07 DIAGNOSIS — I081 Rheumatic disorders of both mitral and tricuspid valves: Secondary | ICD-10-CM | POA: Diagnosis present

## 2021-09-07 DIAGNOSIS — E1122 Type 2 diabetes mellitus with diabetic chronic kidney disease: Secondary | ICD-10-CM | POA: Diagnosis present

## 2021-09-07 DIAGNOSIS — Z23 Encounter for immunization: Secondary | ICD-10-CM

## 2021-09-07 DIAGNOSIS — Z91119 Patient's noncompliance with dietary regimen due to unspecified reason: Secondary | ICD-10-CM | POA: Diagnosis not present

## 2021-09-07 DIAGNOSIS — I130 Hypertensive heart and chronic kidney disease with heart failure and stage 1 through stage 4 chronic kidney disease, or unspecified chronic kidney disease: Secondary | ICD-10-CM | POA: Diagnosis not present

## 2021-09-07 DIAGNOSIS — N179 Acute kidney failure, unspecified: Secondary | ICD-10-CM | POA: Diagnosis present

## 2021-09-07 DIAGNOSIS — D631 Anemia in chronic kidney disease: Secondary | ICD-10-CM | POA: Diagnosis present

## 2021-09-07 DIAGNOSIS — I5023 Acute on chronic systolic (congestive) heart failure: Secondary | ICD-10-CM | POA: Diagnosis present

## 2021-09-07 DIAGNOSIS — M1990 Unspecified osteoarthritis, unspecified site: Secondary | ICD-10-CM | POA: Diagnosis present

## 2021-09-07 DIAGNOSIS — Z20822 Contact with and (suspected) exposure to covid-19: Secondary | ICD-10-CM | POA: Diagnosis present

## 2021-09-07 DIAGNOSIS — E875 Hyperkalemia: Secondary | ICD-10-CM | POA: Diagnosis present

## 2021-09-07 DIAGNOSIS — I2510 Atherosclerotic heart disease of native coronary artery without angina pectoris: Secondary | ICD-10-CM | POA: Diagnosis present

## 2021-09-07 DIAGNOSIS — R0789 Other chest pain: Secondary | ICD-10-CM | POA: Diagnosis not present

## 2021-09-07 DIAGNOSIS — I420 Dilated cardiomyopathy: Secondary | ICD-10-CM | POA: Diagnosis present

## 2021-09-07 DIAGNOSIS — N184 Chronic kidney disease, stage 4 (severe): Secondary | ICD-10-CM | POA: Diagnosis present

## 2021-09-07 DIAGNOSIS — Z8673 Personal history of transient ischemic attack (TIA), and cerebral infarction without residual deficits: Secondary | ICD-10-CM

## 2021-09-07 LAB — CBC WITH DIFFERENTIAL/PLATELET
Abs Immature Granulocytes: 0.07 10*3/uL (ref 0.00–0.07)
Basophils Absolute: 0.1 10*3/uL (ref 0.0–0.1)
Basophils Relative: 1 %
Eosinophils Absolute: 0.2 10*3/uL (ref 0.0–0.5)
Eosinophils Relative: 2 %
HCT: 34.3 % — ABNORMAL LOW (ref 36.0–46.0)
Hemoglobin: 10.7 g/dL — ABNORMAL LOW (ref 12.0–15.0)
Immature Granulocytes: 1 %
Lymphocytes Relative: 20 %
Lymphs Abs: 2.4 10*3/uL (ref 0.7–4.0)
MCH: 28.4 pg (ref 26.0–34.0)
MCHC: 31.2 g/dL (ref 30.0–36.0)
MCV: 91 fL (ref 80.0–100.0)
Monocytes Absolute: 0.9 10*3/uL (ref 0.1–1.0)
Monocytes Relative: 7 %
Neutro Abs: 8.5 10*3/uL — ABNORMAL HIGH (ref 1.7–7.7)
Neutrophils Relative %: 69 %
Platelets: 164 10*3/uL (ref 150–400)
RBC: 3.77 MIL/uL — ABNORMAL LOW (ref 3.87–5.11)
RDW: 14.6 % (ref 11.5–15.5)
WBC: 12.1 10*3/uL — ABNORMAL HIGH (ref 4.0–10.5)
nRBC: 0 % (ref 0.0–0.2)

## 2021-09-07 LAB — COMPREHENSIVE METABOLIC PANEL
ALT: 21 U/L (ref 0–44)
AST: 28 U/L (ref 15–41)
Albumin: 3.5 g/dL (ref 3.5–5.0)
Alkaline Phosphatase: 41 U/L (ref 38–126)
Anion gap: 9 (ref 5–15)
BUN: 40 mg/dL — ABNORMAL HIGH (ref 8–23)
CO2: 21 mmol/L — ABNORMAL LOW (ref 22–32)
Calcium: 9.5 mg/dL (ref 8.9–10.3)
Chloride: 107 mmol/L (ref 98–111)
Creatinine, Ser: 2.77 mg/dL — ABNORMAL HIGH (ref 0.44–1.00)
GFR, Estimated: 17 mL/min — ABNORMAL LOW (ref >60)
Glucose, Bld: 186 mg/dL — ABNORMAL HIGH (ref 70–99)
Potassium: 6.1 mmol/L — ABNORMAL HIGH (ref 3.5–5.1)
Sodium: 137 mmol/L (ref 135–145)
Total Bilirubin: 1.1 mg/dL (ref 0.3–1.2)
Total Protein: 7 g/dL (ref 6.5–8.1)

## 2021-09-07 LAB — TROPONIN I (HIGH SENSITIVITY)
Troponin I (High Sensitivity): 34 ng/L — ABNORMAL HIGH (ref <18)
Troponin I (High Sensitivity): 76 ng/L — ABNORMAL HIGH (ref <18)

## 2021-09-07 LAB — RESP PANEL BY RT-PCR (FLU A&B, COVID) ARPGX2
Influenza A by PCR: NEGATIVE
Influenza B by PCR: NEGATIVE
SARS Coronavirus 2 by RT PCR: NEGATIVE

## 2021-09-07 LAB — GLUCOSE, CAPILLARY
Glucose-Capillary: 117 mg/dL — ABNORMAL HIGH (ref 70–99)
Glucose-Capillary: 144 mg/dL — ABNORMAL HIGH (ref 70–99)

## 2021-09-07 LAB — BRAIN NATRIURETIC PEPTIDE: B Natriuretic Peptide: 2370.6 pg/mL — ABNORMAL HIGH (ref 0.0–100.0)

## 2021-09-07 MED ORDER — INFLUENZA VAC A&B SA ADJ QUAD 0.5 ML IM PRSY
0.5000 mL | PREFILLED_SYRINGE | INTRAMUSCULAR | Status: AC
  Administered 2021-09-10: 0.5 mL via INTRAMUSCULAR
  Filled 2021-09-07: qty 0.5

## 2021-09-07 MED ORDER — ASPIRIN EC 81 MG PO TBEC
81.0000 mg | DELAYED_RELEASE_TABLET | Freq: Every day | ORAL | Status: DC
  Administered 2021-09-07 – 2021-09-11 (×5): 81 mg via ORAL
  Filled 2021-09-07 (×5): qty 1

## 2021-09-07 MED ORDER — CALCIUM GLUCONATE-NACL 1-0.675 GM/50ML-% IV SOLN
1.0000 g | Freq: Once | INTRAVENOUS | Status: AC
  Administered 2021-09-07: 1000 mg via INTRAVENOUS
  Filled 2021-09-07: qty 50

## 2021-09-07 MED ORDER — HEPARIN SODIUM (PORCINE) 5000 UNIT/ML IJ SOLN
5000.0000 [IU] | Freq: Three times a day (TID) | INTRAMUSCULAR | Status: DC
  Administered 2021-09-07 – 2021-09-08 (×4): 5000 [IU] via SUBCUTANEOUS
  Filled 2021-09-07 (×6): qty 1

## 2021-09-07 MED ORDER — SODIUM CHLORIDE 0.9% FLUSH
3.0000 mL | Freq: Two times a day (BID) | INTRAVENOUS | Status: DC
  Administered 2021-09-07 – 2021-09-11 (×7): 3 mL via INTRAVENOUS

## 2021-09-07 MED ORDER — ISOSORB DINITRATE-HYDRALAZINE 20-37.5 MG PO TABS
0.5000 | ORAL_TABLET | Freq: Three times a day (TID) | ORAL | Status: DC
  Administered 2021-09-07 – 2021-09-10 (×8): 0.5 via ORAL
  Filled 2021-09-07 (×7): qty 1
  Filled 2021-09-07: qty 0.5
  Filled 2021-09-07: qty 1
  Filled 2021-09-07: qty 0.5
  Filled 2021-09-07: qty 1

## 2021-09-07 MED ORDER — CARVEDILOL 6.25 MG PO TABS
6.2500 mg | ORAL_TABLET | Freq: Two times a day (BID) | ORAL | Status: DC
  Administered 2021-09-07 – 2021-09-11 (×9): 6.25 mg via ORAL
  Filled 2021-09-07 (×9): qty 1

## 2021-09-07 MED ORDER — ASPIRIN 81 MG PO CHEW
324.0000 mg | CHEWABLE_TABLET | Freq: Once | ORAL | Status: AC
  Administered 2021-09-07: 324 mg via ORAL
  Filled 2021-09-07: qty 4

## 2021-09-07 MED ORDER — PANTOPRAZOLE SODIUM 40 MG PO TBEC
40.0000 mg | DELAYED_RELEASE_TABLET | Freq: Every day | ORAL | Status: DC
Start: 2021-09-07 — End: 2021-09-11
  Administered 2021-09-07 – 2021-09-11 (×5): 40 mg via ORAL
  Filled 2021-09-07 (×5): qty 1

## 2021-09-07 MED ORDER — ALLOPURINOL 100 MG PO TABS
100.0000 mg | ORAL_TABLET | Freq: Every day | ORAL | Status: DC
  Administered 2021-09-07 – 2021-09-11 (×5): 100 mg via ORAL
  Filled 2021-09-07 (×5): qty 1

## 2021-09-07 MED ORDER — FUROSEMIDE 10 MG/ML IJ SOLN
40.0000 mg | Freq: Once | INTRAMUSCULAR | Status: AC
  Administered 2021-09-07: 40 mg via INTRAVENOUS
  Filled 2021-09-07: qty 4

## 2021-09-07 MED ORDER — SODIUM CHLORIDE 0.9% FLUSH: 3.0000 mL | INTRAVENOUS | Status: DC | PRN

## 2021-09-07 MED ORDER — ONDANSETRON HCL 4 MG/2ML IJ SOLN: 4.0000 mg | Freq: Four times a day (QID) | INTRAMUSCULAR | Status: DC | PRN

## 2021-09-07 MED ORDER — ATORVASTATIN CALCIUM 40 MG PO TABS
40.0000 mg | ORAL_TABLET | Freq: Every day | ORAL | Status: DC
  Administered 2021-09-07 – 2021-09-11 (×5): 40 mg via ORAL
  Filled 2021-09-07 (×5): qty 1

## 2021-09-07 MED ORDER — SODIUM CHLORIDE 0.9 % IV SOLN: 250.0000 mL | INTRAVENOUS | Status: DC | PRN

## 2021-09-07 MED ORDER — FUROSEMIDE 10 MG/ML IJ SOLN
40.0000 mg | Freq: Two times a day (BID) | INTRAMUSCULAR | Status: DC
  Administered 2021-09-07 – 2021-09-08 (×2): 40 mg via INTRAVENOUS
  Filled 2021-09-07 (×2): qty 4

## 2021-09-07 MED ORDER — INSULIN ASPART 100 UNIT/ML IJ SOLN
0.0000 [IU] | Freq: Three times a day (TID) | INTRAMUSCULAR | Status: DC
  Administered 2021-09-08: 1 [IU] via SUBCUTANEOUS

## 2021-09-07 MED ORDER — SODIUM ZIRCONIUM CYCLOSILICATE 10 G PO PACK
10.0000 g | PACK | ORAL | Status: AC
  Administered 2021-09-07: 10 g via ORAL
  Filled 2021-09-07: qty 1

## 2021-09-07 NOTE — ED Triage Notes (Signed)
Pt BIB [facility] c/o SOB that has worsen over past 3-4 days. On EMS arrival pt found 64% on room air and tripoding, diaphoretic, and pale. Was given 5 albuterol and 0.5 Atrovent with improvement of SOB. Currently 4 L Oxygen 98%. HX CHF and has noticed increased leg swelling. States compliance with lasix. Heaviness in her chest. IV 18G LAC placed by EMS. Pt able to speak in full sentences in triage. States improvement of SOB

## 2021-09-07 NOTE — H&P (Signed)
Dana Walters is an 75 y.o. female.   Chief Complaint: Chest pressure associated with increasing shortness of breath HPI: Patient is 75 year old female with past medical history significant for nonischemic cardiomyopathy EF 20 to 25% in the past, valvular heart disease in the past, with severe mitral and tricuspid regurgitation, hypertensive heart disease with systolic dysfunction as above, history of congestive heart failure secondary to depressed LV systolic function, type 2 diabetes mellitus, hyperlipidemia, chronic kidney disease stage IV, anemia of chronic disease, history of questionable CVA, Came to ER by EMS complaining of retrosternal chest pressure associated with progressive worsening shortness of breath and was noted to be hypoxic with O2 sats in high 60s on room air patient received breathing treatment IV Lasix and was placed on O2 with improvement in her symptoms.  Patient denies any chest pain at present states breathing has improved.  Patient does give history of PND orthopnea and leg swelling states recently had 2D echo done at Dr. Merrilee Jansky office results not available.  Patient denies any palpitation lightheadedness or syncope.  Denies noncompliance to medications.  Patient was noted to have markedly elevated BNP and elevated potassium of 6.0 and creatinine of 2.7.  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 Past Medical History:  Diagnosis Date   Arthritis    Diabetes mellitus without complication ([place])    Hypertension     Past Surgical History:  Procedure Laterality Date   ACHILLES TENDON SURGERY     KNEE SURGERY     LEFT AND RIGHT HEART CATHETERIZATION WITH CORONARY ANGIOGRAM N/A 11/22/2014   Procedure: LEFT AND RIGHT HEART CATHETERIZATION WITH CORONARY ANGIOGRAM;  Surgeon: [name], MD;  Location: [facility];  Service: Cardiovascular;  Laterality: N/A;    No family history on file. Social History:   reports that she has never smoked. Her smokeless tobacco use includes snuff. She reports that she does not drink alcohol and does not use drugs.  Allergies:  Allergies  Allergen Reactions   Percocet [Oxycodone-Acetaminophen] Itching   Sular [Nisoldipine Er] Itching    Pt has taken amlodipine in the past and tolerated it    (Not in a hospital admission)   Results for orders placed or performed during the hospital encounter of 09/07/21 (from the past 48 hour(s))  CBC with Differential     Status: Abnormal   Collection Time: 09/07/21  3:38 AM  Result Value Ref Range   WBC 12.1 (H) 4.0 - 10.5 K/uL   RBC 3.77 (L) 3.87 - 5.11 MIL/uL   Hemoglobin 10.7 (L) 12.0 - 15.0 g/dL   HCT 34.3 (L) 36.0 - 46.0 %   MCV 91.0 80.0 - 100.0 fL   MCH 28.4 26.0 - 34.0 pg   MCHC 31.2 30.0 - 36.0 g/dL   RDW 14.6 11.5 - 15.5 %   Platelets 164 150 - 400 K/uL    Comment: REPEATED TO VERIFY   nRBC 0.0 0.0 - 0.2 %   Neutrophils Relative % 69 %   Neutro Abs 8.5 (H) 1.7 - 7.7 K/uL   Lymphocytes Relative 20 %   Lymphs Abs 2.4 0.7 - 4.0 K/uL   Monocytes Relative 7 %   Monocytes Absolute 0.9 0.1 - 1.0 K/uL   Eosinophils Relative 2 %   Eosinophils Absolute 0.2 0.0 - 0.5 K/uL   Basophils Relative 1 %   Basophils Absolute 0.1 0.0 - 0.1 K/uL   Immature Granulocytes 1 %  Abs Immature Granulocytes 0.07 0.00 - 0.07 K/uL    Comment: Performed at [facility], [place] [address], [place], [place] [address]  Comprehensive metabolic panel     Status: Abnormal   Collection Time: 09/07/21  3:38 AM  Result Value Ref Range   Sodium 137 135 - 145 mmol/L   Potassium 6.1 (H) 3.5 - 5.1 mmol/L    Comment: NO VISIBLE HEMOLYSIS   Chloride 107 98 - 111 mmol/L   CO2 21 (L) 22 - 32 mmol/L   Glucose, Bld 186 (H) 70 - 99 mg/dL    Comment: Glucose reference range applies only to samples taken after fasting for at least 8 hours.   BUN 40 (H) 8 - 23 mg/dL   Creatinine, Ser 2.77 (H) 0.44 - 1.00 mg/dL   Calcium 9.5 8.9 - 10.3  mg/dL   Total Protein 7.0 6.5 - 8.1 g/dL   Albumin 3.5 3.5 - 5.0 g/dL   AST 28 15 - 41 U/L   ALT 21 0 - 44 U/L   Alkaline Phosphatase 41 38 - 126 U/L   Total Bilirubin 1.1 0.3 - 1.2 mg/dL   GFR, Estimated 17 (L) >60 mL/min    Comment: (NOTE) Calculated using the CKD-EPI Creatinine Equation (2021)    Anion gap 9 5 - 15    Comment: Performed at [facility] [address], [place], [place] [address]  Troponin I (High Sensitivity)     Status: Abnormal   Collection Time: 09/07/21  3:38 AM  Result Value Ref Range   Troponin I (High Sensitivity) 34 (H) <18 ng/L    Comment: (NOTE) Elevated high sensitivity troponin I (hsTnI) values and significant  changes across serial measurements may suggest ACS but many other  chronic and acute conditions are known to elevate hsTnI results.  Refer to the "Links" section for chest pain algorithms and additional  guidance. Performed at [facility], [place] [address], [place], [place] [address]   Brain natriuretic peptide     Status: Abnormal   Collection Time: 09/07/21  3:39 AM  Result Value Ref Range   B Natriuretic Peptide 2,370.6 (H) 0.0 - 100.0 pg/mL    Comment: Performed at [facility] [address], [place], [place] [address]  Resp Panel by RT-PCR (Flu A&B, Covid) Nasopharyngeal Swab     Status: None   Collection Time: 09/07/21  6:18 AM   Specimen: Nasopharyngeal Swab; Nasopharyngeal(NP) swabs in vial transport medium  Result Value Ref Range   SARS Coronavirus 2 by RT PCR NEGATIVE NEGATIVE    Comment: (NOTE) SARS-CoV-2 target nucleic acids are NOT DETECTED.  The SARS-CoV-2 RNA is generally detectable in upper respiratory specimens during the acute phase of infection. The lowest concentration of SARS-CoV-2 viral copies this assay can detect is 138 copies/mL. A negative result does not preclude SARS-Cov-2 infection and should not be used as the sole basis for treatment or other patient management decisions. A negative  result may occur with  improper specimen collection/handling, submission of specimen other than nasopharyngeal swab, presence of viral mutation(s) within the areas targeted by this assay, and inadequate number of viral copies(<138 copies/mL). A negative result must be combined with clinical observations, patient history, and epidemiological information. The expected result is Negative.  Fact Sheet for Patients:  EntrepreneurPulse.com.au  Fact Sheet for Healthcare Providers:  IncredibleEmployment.be  This test is no t yet approved or cleared by the [place] and  has been authorized for  detection and/or diagnosis of SARS-CoV-2 by FDA under an Emergency Use Authorization (EUA). This EUA will remain  in effect (meaning this test can be used) for the duration of the COVID-19 declaration under Section 564(b)(1) of the Act, 21 U.S.C.section 360bbb-3(b)(1), unless the authorization is terminated  or revoked sooner.       Influenza A by PCR NEGATIVE NEGATIVE   Influenza B by PCR NEGATIVE NEGATIVE    Comment: (NOTE) The Xpert Xpress SARS-CoV-2/FLU/RSV plus assay is intended as an aid in the diagnosis of influenza from Nasopharyngeal swab specimens and should not be used as a sole basis for treatment. Nasal washings and aspirates are unacceptable for Xpert Xpress SARS-CoV-2/FLU/RSV testing.  Fact Sheet for Patients: EntrepreneurPulse.com.au  Fact Sheet for Healthcare Providers: IncredibleEmployment.be  This test is not yet approved or cleared by the [place] FDA and has been authorized for detection and/or diagnosis of SARS-CoV-2 by FDA under an Emergency Use Authorization (EUA). This EUA will remain in effect (meaning this test can be used) for the duration of the COVID-19 declaration under Section 564(b)(1) of the Act, 21 U.S.C. section 360bbb-3(b)(1), unless the authorization is terminated  or revoked.  Performed at [facility], [place] [address] [place], [place] [address]    DG Chest 2 View  Result Date: 09/07/2021 CLINICAL DATA:  75 year old female with chest pain and hypoxia. Shortness of breath. EXAM: CHEST - 2 VIEW COMPARISON:  Chest radiographs 10/07/2017 and earlier. FINDINGS: Cardiomegaly since 2015, which waxes and wanes. Pericardial effusion is possible. Other mediastinal contours are within normal limits. Visualized tracheal air column is within normal limits. Diffuse pulmonary vascular congestion with fluid in the pleural fissures and basilar predominant pulmonary edema. Trace layering pleural effusions also, more apparent on the left. No consolidation. No pneumothorax. No acute osseous abnormality identified. Negative visible bowel gas pattern. IMPRESSION: 1. Acute pulmonary edema with small pleural effusions. 2. Cardiomegaly which waxes and wanes since 2015, consider pericardial effusion. Electronically Signed   By: Genevie Ann M.D.   On: 09/07/2021 04:36    Review of Systems  Constitutional:  Positive for diaphoresis. Negative for chills and fever.  HENT:  Negative for sore throat.   Eyes:  Negative for pain.  Respiratory:  Positive for chest tightness and shortness of breath.   Cardiovascular:  Positive for chest pain and leg swelling. Negative for palpitations.  Gastrointestinal:  Negative for abdominal distention and abdominal pain.  Genitourinary:  Negative for difficulty urinating and dysuria.  Neurological:  Negative for dizziness, syncope and facial asymmetry.   Blood pressure (!) 137/98, pulse 84, temperature 98 F (36.7 C), temperature source Oral, resp. rate 14, height '5\' 2"'$  (1.575 m), weight 93.9 kg, SpO2 98 %. Physical Exam Constitutional:      Appearance: She is well-developed.  HENT:     Head: Normocephalic and atraumatic.  Eyes:     Extraocular Movements: Extraocular movements intact.     Pupils: Pupils are equal, round, and reactive to  light.  Neck:     Vascular: JVD present.  Cardiovascular:     Rate and Rhythm: Normal rate and regular rhythm.     Heart sounds: Murmur (2/6 systolic murmur and S3 gallop noted) heard.  Pulmonary:     Breath sounds: Examination of the right-lower field reveals rales. Examination of the left-lower field reveals rales. Rales present.  Abdominal:     General: Bowel sounds are normal.     Palpations: Abdomen is soft.     Tenderness: There is  no abdominal tenderness.  Musculoskeletal:     Cervical back: Normal range of motion and neck supple.     Right lower leg: Edema present.     Left lower leg: Edema present.  Skin:    General: Skin is warm and dry.  Neurological:     Mental Status: She is alert and oriented to person, place, and time.     Assessment/Plan   Acute on chronic systolic congestive heart failure Noncardiac chest pain rule out MI Nonischemic dilated cardiomyopathy Valvular heart disease Hypertension Diabetes mellitus Hyperlipidemia Acute on chronic kidney disease stage IV Anemia of chronic disease Degenerative joint disease Hyperkalemia Plan As per orders Check old records   Charolette Forward, MD 09/07/2021, 8:00 AM

## 2021-09-07 NOTE — ED Notes (Signed)
No needs at this time.  Warm blanket given to visitor.

## 2021-09-07 NOTE — ED Provider Notes (Signed)
Emergency Medicine Provider Triage Evaluation Note  Dana Walters , a 75 y.o. female  was evaluated in triage.  Pt complains of shortness of breath. Patient reports that she feels short of breath at baseline all the time, but she was not significantly more short of breath tonight, which prompted her daughter to call EMS.  EMS reports that on arrival that the patient was pale, clammy, and diaphoretic.  O2 saturations were in the 70s.  She was placed on a nonrebreather, but wheezes were heard on exam and she was given a DuoNeb and then placed on 4 L nasal cannula.  She does not wear home oxygen.  She reports associated chest pain at that she characterizes as a pressure-like and states "it feels like someone is sitting on my chest" that has been present for the last 2 to 3 days consistently.  He has also noticed some swelling in her legs despite compliance with her home  No fevers, chills, cough, abdominal pain, vomiting, syncope, paresthesias.  Review of Systems  Positive: Chest pain, shortness of breath, leg swelling, diaphoresis Negative: Fever, chills, cough, abdominal pain, vomiting, syncope, paresthesias  Physical Exam  Ht '5\' 2"'$  (1.575 m)   Wt 93.9 kg   SpO2 98%   BMI 37.86 kg/m  Gen:   Awake, no distress   Resp:  Nasal cannula in place on 4 L.  She has a mild tachypnea, but no accessory muscle use.  Able to speak in complete sentences with nasal cannula in place. MSK:   Moves extremities without difficulty  Other:  Peripheral edema; no diaphoresis  Medical Decision Making  Medically screening exam initiated at 3:30 AM.  Appropriate orders placed.  [name] was informed that the remainder of the evaluation will be completed by another provider, this initial triage assessment does not replace that evaluation, and the importance of remaining in the ED until their evaluation is complete.  Labs and imaging have been ordered.  She will require close monitoring and charger and has been  made aware that she will need a room soon, but is stable at this time   Joanne Gavel, PA-C 09/07/21 0336    Orpah Greek, MD 09/07/21 [phone]

## 2021-09-07 NOTE — ED Notes (Signed)
Lunch ordered 

## 2021-09-07 NOTE — ED Provider Notes (Addendum)
[facility] EMERGENCY DEPARTMENT Provider Note   CSN: [id number] Arrival date & time: 09/07/21  0324     History Chief Complaint  Patient presents with   Shortness of Breath    Dana Walters is a 75 y.o. female.  Patient presents to the emergency department for evaluation of shortness of breath.  Patient reports that she became suddenly very short of breath tonight.  An ambulance was called to the home and she was found to be hypoxic, room air oxygen saturation was 70%.  She did not use oxygen at home.  She was placed on supplemental oxygen, given a DuoNeb and reports some improvement.  Patient reports that she felt a heaviness on her chest all day yesterday.      Past Medical History:  Diagnosis Date   Arthritis    Diabetes mellitus without complication ([place])    Hypertension     Patient Active Problem List   Diagnosis Date Noted   Acute left systolic heart failure ([place]) 11/21/2014   Essential hypertension 11/21/2014   Morbid obesity ([place]) [id number]   Acute systolic heart failure ([place]) 11/21/2014    Past Surgical History:  Procedure Laterality Date   ACHILLES TENDON SURGERY     KNEE SURGERY     LEFT AND RIGHT HEART CATHETERIZATION WITH CORONARY ANGIOGRAM N/A 11/22/2014   Procedure: LEFT AND RIGHT HEART CATHETERIZATION WITH CORONARY ANGIOGRAM;  Surgeon: [name], MD;  Location: [facility];  Service: Cardiovascular;  Laterality: N/A;     OB History   No obstetric history on file.     No family history on file.  Social History   Tobacco Use   Smoking status: Never   Smokeless tobacco: Current    Types: Snuff  Substance Use Topics   Alcohol use: No   Drug use: No    Home Medications Prior to Admission medications   Medication Sig Start Date End Date Taking? Authorizing Provider  allopurinol (ZYLOPRIM) 100 MG tablet Take 1 tablet (100 mg total) by mouth daily. 11/27/14   Dixie Dials, MD  amLODipine (NORVASC) 2.5 MG tablet Take 2  tablets (5 mg total) by mouth daily. 11/27/14   Dixie Dials, MD  carvedilol (COREG) 6.25 MG tablet Take 1 tablet (6.25 mg total) by mouth 2 (two) times daily with a meal. 11/27/14   Dixie Dials, MD  cloNIDine (CATAPRES) 0.1 MG tablet Take 1 tablet (0.1 mg total) by mouth at bedtime. 11/27/14   Dixie Dials, MD  fenofibrate (TRICOR) 145 MG tablet Take 145 mg by mouth daily. 11/07/14   [provider]  furosemide (LASIX) 40 MG tablet Take 1 tablet (40 mg total) by mouth every Monday, Wednesday, and Friday. 11/27/14   Dixie Dials, MD  gabapentin (NEURONTIN) 300 MG capsule Take 300 mg by mouth 2 (two) times daily. 10/31/14   [provider]  glipiZIDE (GLUCOTROL) 2.5 mg TABS tablet Take 0.5 tablets (2.5 mg total) by mouth daily before breakfast. 11/27/14   Dixie Dials, MD  JANUVIA 100 MG tablet Take 100 mg by mouth daily. 11/19/14   [provider]  omeprazole (PRILOSEC) 20 MG capsule Take 20 mg by mouth daily. 11/01/14   [provider]  potassium chloride (K-DUR,KLOR-CON) 10 MEQ tablet Take 1 tablet (10 mEq total) by mouth every Monday, Wednesday, and Friday. 11/27/14   Dixie Dials, MD  PROAIR HFA 108 (90 BASE) MCG/ACT inhaler Inhale 2 puffs into the lungs 4 (four) times daily as needed. 11/06/14  [provider]    Allergies    Percocet [oxycodone-acetaminophen] and Sular [nisoldipine er]  Review of Systems   Review of Systems  Respiratory:  Positive for chest tightness and shortness of breath.   All other systems reviewed and are negative.  Physical Exam Updated Vital Signs BP (!) 141/108   Pulse 89   Temp 98 F (36.7 C) (Oral)   Resp (!) 24   Ht '5\' 2"'$  (1.575 m)   Wt 93.9 kg   SpO2 100%   BMI 37.86 kg/m   Physical Exam Vitals and nursing note reviewed.  Constitutional:      General: She is not in acute distress.    Appearance: Normal appearance. She is well-developed.  HENT:     Head: Normocephalic and atraumatic.     Right  Ear: Hearing normal.     Left Ear: Hearing normal.     Nose: Nose normal.  Eyes:     Conjunctiva/sclera: Conjunctivae normal.     Pupils: Pupils are equal, round, and reactive to light.  Cardiovascular:     Rate and Rhythm: Regular rhythm.     Heart sounds: S1 normal and S2 normal. No murmur heard.   No friction rub. No gallop.  Pulmonary:     Effort: Pulmonary effort is normal. No respiratory distress.     Breath sounds: Rales present.  Chest:     Chest wall: No tenderness.  Abdominal:     General: Bowel sounds are normal.     Palpations: Abdomen is soft.     Tenderness: There is no abdominal tenderness. There is no guarding or rebound. Negative signs include Murphy's sign and McBurney's sign.     Hernia: No hernia is present.  Musculoskeletal:        General: Normal range of motion.     Cervical back: Normal range of motion and neck supple.     Right lower leg: Edema present.     Left lower leg: Edema present.  Skin:    General: Skin is warm and dry.     Findings: No rash.  Neurological:     Mental Status: She is alert and oriented to person, place, and time.     GCS: GCS eye subscore is 4. GCS verbal subscore is 5. GCS motor subscore is 6.     Cranial Nerves: No cranial nerve deficit.     Sensory: No sensory deficit.     Coordination: Coordination normal.  Psychiatric:        Speech: Speech normal.        Behavior: Behavior normal.        Thought Content: Thought content normal.    ED Results / Procedures / Treatments   Labs (all labs ordered are listed, but only abnormal results are displayed) Labs Reviewed  CBC WITH DIFFERENTIAL/PLATELET - Abnormal; Notable for the following components:      Result Value   WBC 12.1 (*)    RBC 3.77 (*)    Hemoglobin 10.7 (*)    HCT 34.3 (*)    Neutro Abs 8.5 (*)    All other components within normal limits  COMPREHENSIVE METABOLIC PANEL - Abnormal; Notable for the following components:   Potassium 6.1 (*)    CO2 21 (*)     Glucose, Bld 186 (*)    BUN 40 (*)    Creatinine, Ser 2.77 (*)    GFR, Estimated 17 (*)    All other components within normal limits  BRAIN NATRIURETIC  PEPTIDE - Abnormal; Notable for the following components:   B Natriuretic Peptide 2,370.6 (*)    All other components within normal limits  TROPONIN I (HIGH SENSITIVITY) - Abnormal; Notable for the following components:   Troponin I (High Sensitivity) 34 (*)    All other components within normal limits  RESP PANEL BY RT-PCR (FLU A&B, COVID) [id number]    EKG None  Radiology DG Chest 2 View  Result Date: 09/07/2021 CLINICAL DATA:  75 year old female with chest pain and hypoxia. Shortness of breath. EXAM: CHEST - 2 VIEW COMPARISON:  Chest radiographs 10/07/2017 and earlier. FINDINGS: Cardiomegaly since 2015, which waxes and wanes. Pericardial effusion is possible. Other mediastinal contours are within normal limits. Visualized tracheal air column is within normal limits. Diffuse pulmonary vascular congestion with fluid in the pleural fissures and basilar predominant pulmonary edema. Trace layering pleural effusions also, more apparent on the left. No consolidation. No pneumothorax. No acute osseous abnormality identified. Negative visible bowel gas pattern. IMPRESSION: 1. Acute pulmonary edema with small pleural effusions. 2. Cardiomegaly which waxes and wanes since 2015, consider pericardial effusion. Electronically Signed   By: Genevie Ann M.D.   On: 09/07/2021 04:36    Procedures Procedures   Medications Ordered in ED Medications  aspirin chewable tablet 324 mg (has no administration in time range)  furosemide (LASIX) injection 40 mg (has no administration in time range)  sodium zirconium cyclosilicate (LOKELMA) packet 10 g (has no administration in time range)    ED Course  I have reviewed the triage vital signs and the nursing notes.  Pertinent labs & imaging results that were available during my care of the patient were reviewed by me  and considered in my medical decision making (see chart for details).    MDM Rules/Calculators/A&P                           Patient brought to the emergency department from home for hypoxia and shortness of breath.  Patient has a history of systolic congestive heart failure.  She takes Lasix at home for this.  She reports that she has not had any change in her dosing.  Patient was hypoxic when EMS arrived, improved with supplemental oxygen which she does not normally use.  Work-up today shows pulmonary edema.  She will require hospitalization for further management, initiated on first dose of IV Lasix.  Patient does have a history of chronic renal insufficiency.  Kidney function is worse today and she does have hyperkalemia, potassium 6.1.  Patient administered Lokelma, IV Lasix.  Has already received beta-adrenergic treatment.  She does not have any EKG changes, will continue to monitor.  CRITICAL CARE Performed by: Orpah Greek   Total critical care time: 31 minutes  Critical care time was exclusive of separately billable procedures and treating other patients.  Critical care was necessary to treat or prevent imminent or life-threatening deterioration.  Critical care was time spent personally by me on the following activities: development of treatment plan with patient and/or surrogate as well as nursing, discussions with consultants, evaluation of patient's response to treatment, examination of patient, obtaining history from patient or surrogate, ordering and performing treatments and interventions, ordering and review of laboratory studies, ordering and review of radiographic studies, pulse oximetry and re-evaluation of patient's condition.  Final Clinical Impression(s) / ED Diagnoses Final diagnoses:  Acute on chronic systolic congestive heart failure (HCC)  AKI (acute kidney injury) (HCC)  Hyperkalemia  Rx / DC Orders ED Discharge Orders     None         Jigar Zielke, Gwenyth Allegra, MD 09/07/21 [id number]    Orpah Greek, MD 09/07/21 [phone]

## 2021-09-07 NOTE — ED Notes (Signed)
Breakfast ordered for patient  

## 2021-09-07 NOTE — ED Notes (Signed)
According to Epic, covid swab was collected and sent to lab at 0338. This RN called the lab to check on the status but the lab stated they did not have the specimen. Pt updated and swabbed again.

## 2021-09-08 LAB — BASIC METABOLIC PANEL
Anion gap: 8 (ref 5–15)
BUN: 52 mg/dL — ABNORMAL HIGH (ref 8–23)
CO2: 23 mmol/L (ref 22–32)
Calcium: 9.8 mg/dL (ref 8.9–10.3)
Chloride: 107 mmol/L (ref 98–111)
Creatinine, Ser: 3.36 mg/dL — ABNORMAL HIGH (ref 0.44–1.00)
GFR, Estimated: 14 mL/min — ABNORMAL LOW (ref >60)
Glucose, Bld: 98 mg/dL (ref 70–99)
Potassium: 5.4 mmol/L — ABNORMAL HIGH (ref 3.5–5.1)
Sodium: 138 mmol/L (ref 135–145)

## 2021-09-08 LAB — HEMOGLOBIN A1C
Hgb A1c MFr Bld: 5.2 % (ref 4.8–5.6)
Mean Plasma Glucose: 102.54 mg/dL

## 2021-09-08 LAB — URINALYSIS, ROUTINE W REFLEX MICROSCOPIC
Bacteria, UA: NONE SEEN
Bilirubin Urine: NEGATIVE
Glucose, UA: NEGATIVE mg/dL
Hgb urine dipstick: NEGATIVE
Ketones, ur: NEGATIVE mg/dL
Nitrite: NEGATIVE
Protein, ur: NEGATIVE mg/dL
Specific Gravity, Urine: 1.008 (ref 1.005–1.030)
pH: 5 (ref 5.0–8.0)

## 2021-09-08 LAB — GLUCOSE, CAPILLARY
Glucose-Capillary: 105 mg/dL — ABNORMAL HIGH (ref 70–99)
Glucose-Capillary: 106 mg/dL — ABNORMAL HIGH (ref 70–99)
Glucose-Capillary: 131 mg/dL — ABNORMAL HIGH (ref 70–99)
Glucose-Capillary: 128 mg/dL — ABNORMAL HIGH (ref 70–99)

## 2021-09-08 LAB — MAGNESIUM: Magnesium: 1.6 mg/dL — ABNORMAL LOW (ref 1.7–2.4)

## 2021-09-08 MED ORDER — ACETAMINOPHEN 325 MG PO TABS: 650.0000 mg | ORAL_TABLET | Freq: Four times a day (QID) | ORAL | Status: DC | PRN

## 2021-09-08 MED ORDER — SODIUM CHLORIDE 0.9 % IV SOLN
1.0000 g | INTRAVENOUS | Status: DC
  Administered 2021-09-08 – 2021-09-11 (×4): 1 g via INTRAVENOUS
  Filled 2021-09-08 (×4): qty 10

## 2021-09-08 MED ORDER — ACETAMINOPHEN-CODEINE #3 300-30 MG PO TABS
1.0000 | ORAL_TABLET | Freq: Two times a day (BID) | ORAL | Status: DC | PRN
Start: 2021-09-08 — End: 2021-09-11
  Administered 2021-09-08 – 2021-09-10 (×4): 1 via ORAL
  Filled 2021-09-08 (×4): qty 1

## 2021-09-08 MED ORDER — FUROSEMIDE 10 MG/ML IJ SOLN
40.0000 mg | Freq: Every day | INTRAMUSCULAR | Status: DC
  Administered 2021-09-09: 40 mg via INTRAVENOUS
  Filled 2021-09-08: qty 4

## 2021-09-08 MED ORDER — MAGNESIUM SULFATE 2 GM/50ML IV SOLN
2.0000 g | Freq: Once | INTRAVENOUS | Status: AC
  Administered 2021-09-08: 2 g via INTRAVENOUS
  Filled 2021-09-08: qty 50

## 2021-09-08 MED ORDER — ACETAMINOPHEN 325 MG PO TABS
650.0000 mg | ORAL_TABLET | Freq: Four times a day (QID) | ORAL | Status: DC | PRN
  Administered 2021-09-08 (×2): 650 mg via ORAL
  Filled 2021-09-08 (×2): qty 2

## 2021-09-08 NOTE — Progress Notes (Signed)
Pt c/o ongoing 8/10 headache. Tylenol ineffective. MD paged and responded while RN in room with patient and daughter. Pt and daughter state the patient takes Tylenol #3 PRN. Order received for Tylenol #3 1 tab q12 hours PRN.

## 2021-09-08 NOTE — Progress Notes (Signed)
   09/08/21 1500  Mobility  Activity Refused mobility (Declined; requested to be cleaned up)

## 2021-09-08 NOTE — Progress Notes (Signed)
Subjective:  Complains of left flank pain and dysuria.  Denies any fever or chills.  States breathing has improved  Objective:  Vital Signs in the last 24 hours: Temp:  [97.9 F (36.6 C)-99.1 F (37.3 C)] 98.1 F (36.7 C) (10/10 0818) Pulse Rate:  [37-102] 84 (10/10 0818) Resp:  [16-20] 20 (10/10 0412) BP: (117-142)/(72-96) 124/83 (10/10 0818) SpO2:  [96 %-100 %] 100 % (10/10 0818) Weight:  [82.3 kg] 82.3 kg (10/10 0412)  Intake/Output from previous day: 10/09 0701 - 10/10 0700 In: 220 [P.O.:220] Out: 1850 [Urine:1850] Intake/Output from this shift: Total I/O In: 170 [P.O.:120; IV Piggyback:50] Out: 300 [Urine:300]  Physical Exam: Neck: no adenopathy, no carotid bruit, no JVD, and supple, symmetrical, trachea midline Lungs: decreased breath sounds at bases.  Air entry has improved Heart: irregularly irregular rhythm, S1, S2 normal, and 2/6 systolic murmur noted Abdomen: soft, non-tender; bowel sounds normal; no masses,  no organomegaly Extremities: extremities normal, atraumatic, no cyanosis or edema Neurologic: Grossly normal  Lab Results: Recent Labs    09/07/21 0338  WBC 12.1*  HGB 10.7*  PLT 164   Recent Labs    09/07/21 0338 09/08/21 0339  NA 137 138  K 6.1* 5.4*  CL 107 107  CO2 21* 23  GLUCOSE 186* 98  BUN 40* 52*  CREATININE 2.77* 3.36*   No results for input(s): TROPONINI in the last 72 hours.  Invalid input(s): CK, MB Hepatic Function Panel Recent Labs    09/07/21 0338  PROT 7.0  ALBUMIN 3.5  AST 28  ALT 21  ALKPHOS 41  BILITOT 1.1   No results for input(s): CHOL in the last 72 hours. No results for input(s): PROTIME in the last 72 hours.  Imaging: DG Chest 2 View  Result Date: 09/07/2021 CLINICAL DATA:  75 year old female with chest pain and hypoxia. Shortness of breath. EXAM: CHEST - 2 VIEW COMPARISON:  Chest radiographs 10/07/2017 and earlier. FINDINGS: Cardiomegaly since 2015, which waxes and wanes. Pericardial effusion is  possible. Other mediastinal contours are within normal limits. Visualized tracheal air column is within normal limits. Diffuse pulmonary vascular congestion with fluid in the pleural fissures and basilar predominant pulmonary edema. Trace layering pleural effusions also, more apparent on the left. No consolidation. No pneumothorax. No acute osseous abnormality identified. Negative visible bowel gas pattern. IMPRESSION: 1. Acute pulmonary edema with small pleural effusions. 2. Cardiomegaly which waxes and wanes since 2015, consider pericardial effusion. Electronically Signed   By: Genevie Ann M.D.   On: 09/07/2021 04:36    Cardiac Studies:  Assessment/Plan:  Acute on chronic systolic congestive heart failure Noncardiac chest pain rule out MI Nonischemic dilated cardiomyopathy Valvular heart disease Hypertension Diabetes mellitus Hyperlipidemia Acute on chronic kidney disease stage IV Anemia of chronic disease Degenerative joint disease Possible UTI.   Plan Check UA and urine culture. Start Rocephin as per orders. Reduce Lasix to 40 mg daily Check labs in a.m.   LOS: 1 day    Charolette Forward 09/08/2021, 11:29 AM

## 2021-09-09 LAB — CBC
HCT: 32 % — ABNORMAL LOW (ref 36.0–46.0)
Hemoglobin: 10.1 g/dL — ABNORMAL LOW (ref 12.0–15.0)
MCH: 28.1 pg (ref 26.0–34.0)
MCHC: 31.6 g/dL (ref 30.0–36.0)
MCV: 89.1 fL (ref 80.0–100.0)
Platelets: 160 10*3/uL (ref 150–400)
RBC: 3.59 MIL/uL — ABNORMAL LOW (ref 3.87–5.11)
RDW: 14.2 % (ref 11.5–15.5)
WBC: 8.8 10*3/uL (ref 4.0–10.5)
nRBC: 0 % (ref 0.0–0.2)

## 2021-09-09 LAB — URINE CULTURE: Culture: <10000 — AB

## 2021-09-09 LAB — BASIC METABOLIC PANEL
Anion gap: 7 (ref 5–15)
BUN: 60 mg/dL — ABNORMAL HIGH (ref 8–23)
CO2: 24 mmol/L (ref 22–32)
Calcium: 9.7 mg/dL (ref 8.9–10.3)
Chloride: 103 mmol/L (ref 98–111)
Creatinine, Ser: 3.37 mg/dL — ABNORMAL HIGH (ref 0.44–1.00)
GFR, Estimated: 14 mL/min — ABNORMAL LOW (ref >60)
Glucose, Bld: 101 mg/dL — ABNORMAL HIGH (ref 70–99)
Potassium: 5.1 mmol/L (ref 3.5–5.1)
Sodium: 134 mmol/L — ABNORMAL LOW (ref 135–145)

## 2021-09-09 LAB — GLUCOSE, CAPILLARY
Glucose-Capillary: 90 mg/dL (ref 70–99)
Glucose-Capillary: 110 mg/dL — ABNORMAL HIGH (ref 70–99)
Glucose-Capillary: 114 mg/dL — ABNORMAL HIGH (ref 70–99)
Glucose-Capillary: 162 mg/dL — ABNORMAL HIGH (ref 70–99)

## 2021-09-09 MED ORDER — INSULIN ASPART 100 UNIT/ML IJ SOLN
0.0000 [IU] | Freq: Three times a day (TID) | INTRAMUSCULAR | Status: DC
  Administered 2021-09-10: 1 [IU] via SUBCUTANEOUS

## 2021-09-09 NOTE — Progress Notes (Signed)
Heart Failure Navigator Progress Note  Assessed for Heart & Vascular TOC clinic readiness. Will continue to follow this admission to see progression of renal function. A/C CKD IV, SCr >3.   Navigator available for reassessment of patient.   Pricilla Holm, MSN, RN Heart Failure Nurse Navigator [phone]

## 2021-09-09 NOTE — Progress Notes (Signed)
   09/09/21 1541  Mobility  Activity Contraindicated/medical hold (Patient has active bedrest order)  Mobility Response RN notified

## 2021-09-09 NOTE — Progress Notes (Signed)
Subjective:  Patient denies any chest pain or shortness of breath and left flank pain improved.  Denies any fever or chills.  Urine culture results pending  Objective:  Vital Signs in the last 24 hours: Temp:  [97.7 F (36.5 C)-98.7 F (37.1 C)] 97.7 F (36.5 C) (10/11 0949) Pulse Rate:  [77-85] 84 (10/11 0949) Resp:  [18-19] 18 (10/11 0248) BP: (115-135)/(76-88) 124/79 (10/11 0949) SpO2:  [99 %-100 %] 100 % (10/11 0949) Weight:  [81.9 kg] 81.9 kg (10/11 0248)  Intake/Output from previous day: 10/10 0701 - 10/11 0700 In: 750 [P.O.:600; IV Piggyback:150] Out: 1850 [Urine:1850] Intake/Output from this shift: Total I/O In: 220 [P.O.:120; IV Piggyback:100] Out: 550 [Urine:550]  Physical Exam: Neck: no adenopathy, no carotid bruit, no JVD, and supple, symmetrical, trachea midline Lungs: clear to auscultation bilaterally Heart: regular rate and rhythm, S1, S2 normal, and 2/6 systolic murmur noted Abdomen: soft, non-tender; bowel sounds normal; no masses,  no organomegaly Extremities: extremities normal, atraumatic, no cyanosis or edema  Lab Results: Recent Labs    09/07/21 0338 09/09/21 0240  WBC 12.1* 8.8  HGB 10.7* 10.1*  PLT 164 160   Recent Labs    09/08/21 0339 09/09/21 0240  NA 138 134*  K 5.4* 5.1  CL 107 103  CO2 23 24  GLUCOSE 98 101*  BUN 52* 60*  CREATININE 3.36* 3.37*   No results for input(s): TROPONINI in the last 72 hours.  Invalid input(s): CK, MB Hepatic Function Panel Recent Labs    09/07/21 0338  PROT 7.0  ALBUMIN 3.5  AST 28  ALT 21  ALKPHOS 41  BILITOT 1.1   No results for input(s): CHOL in the last 72 hours. No results for input(s): PROTIME in the last 72 hours.  Imaging: No results found.  Cardiac Studies:  Assessment/Plan:  Resolving acute on chronic systolic congestive heart failure Status post Noncardiac chest pain  Nonischemic dilated cardiomyopathy Valvular heart disease Hypertension Diabetes  mellitus Hyperlipidemia Acute on chronic kidney disease stage IV Prerenal azotemia Anemia of chronic disease Degenerative joint disease Possible UTI.   Plan Hold Lasix for now. Check renal function in a.m. Increase ambulation as tolerated. Check cultures  LOS: 2 days    Charolette Forward 09/09/2021, 1:08 PM

## 2021-09-10 LAB — MAGNESIUM: Magnesium: 1.7 mg/dL (ref 1.7–2.4)

## 2021-09-10 LAB — GLUCOSE, CAPILLARY
Glucose-Capillary: 83 mg/dL (ref 70–99)
Glucose-Capillary: 95 mg/dL (ref 70–99)
Glucose-Capillary: 149 mg/dL — ABNORMAL HIGH (ref 70–99)
Glucose-Capillary: 161 mg/dL — ABNORMAL HIGH (ref 70–99)

## 2021-09-10 LAB — CBC
HCT: 32.2 % — ABNORMAL LOW (ref 36.0–46.0)
Hemoglobin: 10.2 g/dL — ABNORMAL LOW (ref 12.0–15.0)
MCH: 28.3 pg (ref 26.0–34.0)
MCHC: 31.7 g/dL (ref 30.0–36.0)
MCV: 89.2 fL (ref 80.0–100.0)
Platelets: 164 10*3/uL (ref 150–400)
RBC: 3.61 MIL/uL — ABNORMAL LOW (ref 3.87–5.11)
RDW: 14.4 % (ref 11.5–15.5)
WBC: 8.6 10*3/uL (ref 4.0–10.5)
nRBC: 0 % (ref 0.0–0.2)

## 2021-09-10 LAB — BASIC METABOLIC PANEL
Anion gap: 9 (ref 5–15)
BUN: 68 mg/dL — ABNORMAL HIGH (ref 8–23)
CO2: 23 mmol/L (ref 22–32)
Calcium: 10 mg/dL (ref 8.9–10.3)
Chloride: 105 mmol/L (ref 98–111)
Creatinine, Ser: 3.43 mg/dL — ABNORMAL HIGH (ref 0.44–1.00)
GFR, Estimated: 13 mL/min — ABNORMAL LOW (ref >60)
Glucose, Bld: 105 mg/dL — ABNORMAL HIGH (ref 70–99)
Potassium: 5.5 mmol/L — ABNORMAL HIGH (ref 3.5–5.1)
Sodium: 137 mmol/L (ref 135–145)

## 2021-09-10 MED ORDER — SODIUM CHLORIDE 0.9 % IV SOLN: INTRAVENOUS | Status: DC

## 2021-09-10 MED ORDER — ISOSORB DINITRATE-HYDRALAZINE 20-37.5 MG PO TABS
1.0000 | ORAL_TABLET | Freq: Three times a day (TID) | ORAL | Status: DC
  Administered 2021-09-10 – 2021-09-11 (×4): 1 via ORAL
  Filled 2021-09-10 (×4): qty 1

## 2021-09-10 MED ORDER — SODIUM ZIRCONIUM CYCLOSILICATE 10 G PO PACK
10.0000 g | PACK | Freq: Every day | ORAL | Status: DC
  Administered 2021-09-10: 10 g via ORAL
  Filled 2021-09-10 (×2): qty 1

## 2021-09-10 NOTE — Progress Notes (Signed)
Subjective:  Patient denies any chest pain or shortness of breath.  States overall feels much better.  Denies any urinary complaints.  No flank pain, improved.patient did receive IV Lasix yesterday.  Renal function remains elevated.  Had renal ultrasound in the past which showed chronic renal disease.recent 2-D echo result could not be located from [facility] office.  Objective:  Vital Signs in the last 24 hours: Temp:  [97.7 F (36.5 C)-98.8 F (37.1 C)] 98 F (36.7 C) (10/12 0757) Pulse Rate:  [82-95] 95 (10/12 0757) Resp:  [17-20] 20 (10/12 0614) BP: (107-140)/(67-90) 140/90 (10/12 0757) SpO2:  [97 %-100 %] 98 % (10/12 0757) Weight:  [80.7 kg] 80.7 kg (10/12 0101)  Intake/Output from previous day: 10/11 0701 - 10/12 0700 In: 460 [P.O.:360; IV Piggyback:100] Out: 2150 [Urine:2150] Intake/Output from this shift: Total I/O In: 120 [P.O.:120] Out: -   Physical Exam: Neck: no adenopathy, no carotid bruit, no JVD, and supple, symmetrical, trachea midline Lungs: clear to auscultation bilaterally Heart: regular rate and rhythm, S1, S2 normal, and 2/6 systolic murmur noted Abdomen: soft, non-tender; bowel sounds normal; no masses,  no organomegaly Extremities: extremities normal, atraumatic, no cyanosis or edema  Lab Results: Recent Labs    09/09/21 0240 09/10/21 0309  WBC 8.8 8.6  HGB 10.1* 10.2*  PLT 160 164   Recent Labs    09/09/21 0240 09/10/21 0309  NA 134* 137  K 5.1 5.5*  CL 103 105  CO2 24 23  GLUCOSE 101* 105*  BUN 60* 68*  CREATININE 3.37* 3.43*   No results for input(s): TROPONINI in the last 72 hours.  Invalid input(s): CK, MB Hepatic Function Panel No results for input(s): PROT, ALBUMIN, AST, ALT, ALKPHOS, BILITOT, BILIDIR, IBILI in the last 72 hours. No results for input(s): CHOL in the last 72 hours. No results for input(s): PROTIME in the last 72 hours.  Imaging: No results found.  Cardiac Studies:  Assessment/Plan:  Compensated acute on  chronic systolic congestive heart failure Status post Noncardiac chest pain  Nonischemic dilated cardiomyopathy Valvular heart disease Hypertension Diabetes mellitus Hyperlipidemia Acute on chronic kidney disease stage IV Prerenal azotemia Anemia of chronic disease Degenerative joint disease Possible UTI.   Hyperkalemia. Plan Rx for hyperkalemia as per orders. IV fluid normal saline 50 cc/h for 10 hours. Check labs in a.m. May need renal consult if renal function deteriorates Increase BiDil as per orders  LOS: 3 days    Charolette Forward 09/10/2021, 9:10 AM

## 2021-09-11 ENCOUNTER — Inpatient Hospital Stay (HOSPITAL_COMMUNITY): Payer: Medicare HMO

## 2021-09-11 DIAGNOSIS — Z23 Encounter for immunization: Secondary | ICD-10-CM | POA: Diagnosis present

## 2021-09-11 LAB — BASIC METABOLIC PANEL
Anion gap: 8 (ref 5–15)
BUN: 68 mg/dL — ABNORMAL HIGH (ref 8–23)
CO2: 24 mmol/L (ref 22–32)
Calcium: 9.5 mg/dL (ref 8.9–10.3)
Chloride: 106 mmol/L (ref 98–111)
Creatinine, Ser: 3.17 mg/dL — ABNORMAL HIGH (ref 0.44–1.00)
GFR, Estimated: 15 mL/min — ABNORMAL LOW (ref >60)
Glucose, Bld: 158 mg/dL — ABNORMAL HIGH (ref 70–99)
Potassium: 4.7 mmol/L (ref 3.5–5.1)
Sodium: 138 mmol/L (ref 135–145)

## 2021-09-11 LAB — IRON AND TIBC
Iron: 58 ug/dL (ref 28–170)
Saturation Ratios: 15 % (ref 10.4–31.8)
TIBC: 375 ug/dL (ref 250–450)
UIBC: 317 ug/dL

## 2021-09-11 LAB — GLUCOSE, CAPILLARY
Glucose-Capillary: 107 mg/dL — ABNORMAL HIGH (ref 70–99)
Glucose-Capillary: 118 mg/dL — ABNORMAL HIGH (ref 70–99)
Glucose-Capillary: 122 mg/dL — ABNORMAL HIGH (ref 70–99)

## 2021-09-11 LAB — LIPID PANEL
Cholesterol: 123 mg/dL (ref 0–200)
HDL: 32 mg/dL — ABNORMAL LOW (ref >40)
LDL Cholesterol: 58 mg/dL (ref 0–99)
Total CHOL/HDL Ratio: 3.8 RATIO
Triglycerides: 163 mg/dL — ABNORMAL HIGH (ref <150)
VLDL: 33 mg/dL (ref 0–40)

## 2021-09-11 LAB — ECHOCARDIOGRAM COMPLETE
Calc EF: 27.6 %
Height: 62 in
MV M vel: 5.17 m/s
MV Peak grad: 106.9 mmHg
Radius: 0.3 cm
S' Lateral: 6 cm
Single Plane A2C EF: 15.5 %
Single Plane A4C EF: 37.7 %
Weight: 2849.6 oz

## 2021-09-11 LAB — FERRITIN: Ferritin: 264 ng/mL (ref 11–307)

## 2021-09-11 MED ORDER — HYDRALAZINE HCL 25 MG PO TABS: 25.0000 mg | ORAL_TABLET | Freq: Two times a day (BID) | ORAL | Status: DC

## 2021-09-11 MED ORDER — HYDRALAZINE HCL 25 MG PO TABS: 25.0000 mg | ORAL_TABLET | Freq: Two times a day (BID) | ORAL | 6 refills | Status: DC

## 2021-09-11 MED ORDER — ASPIRIN 81 MG PO TBEC: 81.0000 mg | DELAYED_RELEASE_TABLET | Freq: Every day | ORAL | 11 refills | Status: AC

## 2021-09-11 MED ORDER — PERFLUTREN LIPID MICROSPHERE
1.0000 mL | INTRAVENOUS | Status: AC | PRN
  Administered 2021-09-11: 2 mL via INTRAVENOUS
  Filled 2021-09-11: qty 10

## 2021-09-11 MED ORDER — ISOSORBIDE DINITRATE 20 MG PO TABS: 20.0000 mg | ORAL_TABLET | Freq: Two times a day (BID) | ORAL | 3 refills | Status: AC

## 2021-09-11 MED ORDER — ISOSORBIDE DINITRATE 10 MG PO TABS: 20.0000 mg | ORAL_TABLET | Freq: Two times a day (BID) | ORAL | Status: DC

## 2021-09-11 MED ORDER — SITAGLIPTIN PHOSPHATE 100 MG PO TABS: 50.0000 mg | ORAL_TABLET | Freq: Every day | ORAL | Status: AC

## 2021-09-11 NOTE — Care Management Important Message (Signed)
Important Message  Patient Details  Name: LENNY KINGRY MRN: [id number] Date of Birth: [birth year]   Medicare Important Message Given:  Yes     Shelda Altes 09/11/2021, 9:13 AM

## 2021-09-11 NOTE — TOC Transition Note (Signed)
Transition of Care [facility]) - CM/SW Discharge Note   Patient Details  Name: Dana Walters MRN: [id number] Date of Birth: 11/02/1946  [facility] CM/SW Contact:  Zenon Mayo, RN Phone Number: 09/11/2021, 4:34 PM   Clinical Narrative:    NCM spoke with patient at bedside, she lives with her two daughters and two granddaughters.  She has transportation at [employer].  She states she has a scale at home and she has a bp cuff.  She tries to eat fresh veges but sometimes does can foods.  NCM informed her to try not to do can foods.  She states she has also applied for Direct Medicaid which will be her secondary insurance.  She has no needs.   Final next level of care: Home/Self Care Barriers to Discharge: No Barriers Identified   Patient Goals and CMS Choice Patient states their goals for this hospitalization and ongoing recovery are:: to return home   Choice offered to / list presented to : NA  Discharge Placement                       Discharge Plan and Services                  DME Agency: NA       HH Arranged: NA          Social Determinants of Health (SDOH) Interventions     Readmission Risk Interventions No flowsheet data found.

## 2021-09-11 NOTE — Progress Notes (Signed)
  Mobility Specialist Criteria Algorithm Info.   09/11/21 1030  Mobility  Activity  (Patient still has active bedrest order. Will check back)    09/11/2021 10:30 AM

## 2021-09-11 NOTE — Discharge Summary (Signed)
Physician Discharge Summary  Patient ID: Dana Walters MRN: [id number] DOB/AGE: 05/28/1946 75 y.o.  Admit date: 09/07/2021 Discharge date: 09/11/2021  Admission Diagnoses: Acute on chronic systolic congestive heart failure Noncardiac chest pain Nonischemic dilated cardiomyopathy Valvular heart disease Hypertension Type 2 DM Hyperlipidemia Acute on chronic kidney disease stage IV Anemia of chronic disease Hyperkalemia Degenerative joint disease  Discharge Diagnoses:  Principle diagnosis: Acute on chronic systolic heart failure Active Problems:   Chest pain   Nonischemic dilated cardiomyopathy   Moderate MR   Mild TR   Hypertension   Acute on chronic kidney disease IV   Anemia of chrionic disease   Hyperkalemia, resolved   Hyperlipidemia   Type 2 DM   Degenerative joint disease  Discharged Condition: fair  Hospital Course: 75 years old black female with PMH of nonischemic dilated cardiomyopathy, Hypertensive heart disease, MR, TR, type 2 DM, hyperlipidemia and CKD stage IV had chest pain and worsening shortness of breath with hypoxia. Patient admits to dietary non-compliance. She was diuresed with lasix with improvement in potassium level and shortness of breath as well as oxygen saturation. Her echocardiogram showed dilated cardiomyopathy as before. Patient has stage IV CKD. Her diabetes is controlled with small dose of Januvia and she agrees to give up sweet food and lose 5-10 pounds of weight, limit fluid and salt intake. She may go on small dose of SQ insulin if needed in future.  She is not eligible for Jardiance and Enresto or ACEI or ARB. She may benefit from Isosorbide and hydralazine for BP control and heart failure. She also wants to postpone R and L heart catheterization for now. She will see me in 1 week and bring sugar record and weight chart..   Consults: cardiology  Significant Diagnostic Studies: labs: Mildly elevated WBC and Hgb of 10.7 g/dL. Elevated blood  sugar of 186 on admission, BUN and Cr of 40/2.77 mg. Hgb A1C of 5.2 %. BNP was 2370 pg/mL.  CXR: Acute pulmonary edema.  EKG: SR, LVH.  Echocardiogram: Dilated cardiomyopathy. Moderate MR and mild TR.  Treatments: cardiac meds: carvedilol, furosemide, and isosorbide and hydralazine.  Discharge Exam: Blood pressure (!) 124/103, pulse 87, temperature 98.6 F (37 C), temperature source Oral, resp. rate 16, height '5\' 2"'$  (1.575 m), weight 80.8 kg, SpO2 97 %. General appearance: alert, cooperative and appears stated age. Head: Normocephalic, atraumatic. Eyes: Brown eyes, pink conjunctiva, corneas clear.   Neck: No adenopathy, no carotid bruit, no JVD, supple, symmetrical, trachea midline and thyroid not enlarged. Resp: Clear to auscultation bilaterally. Cardio: Regular rate and rhythm, S1, S2 normal, II/VI systolic murmur, no click, rub or gallop. GI: Soft, non-tender; bowel sounds normal; no organomegaly. Extremities: No edema, cyanosis or clubbing. Skin: Warm and dry.  Neurologic: Alert and oriented X 3, normal strength and tone. Normal coordination and gait.  Disposition: Discharge disposition: 01-Home or Self Care        Allergies as of 09/11/2021       Reactions   Percocet [oxycodone-acetaminophen] Itching   Sular [nisoldipine Er] Itching   Pt has taken amlodipine in the past and tolerated it        Medication List     STOP taking these medications    amLODipine 2.5 MG tablet Commonly known as: NORVASC   cloNIDine 0.1 MG tablet Commonly known as: CATAPRES   glipiZIDE 2.5 mg Tabs tablet Commonly known as: GLUCOTROL   potassium chloride 10 MEQ tablet Commonly known as: Klor-Con M20   sodium chloride 5 %  ophthalmic solution Commonly known as: MURO 128   spironolactone 25 MG tablet Commonly known as: ALDACTONE       TAKE these medications    allopurinol 100 MG tablet Commonly known as: Zyloprim Take 1 tablet (100 mg total) by mouth daily.   aspirin  81 MG EC tablet Take 1 tablet (81 mg total) by mouth daily. Swallow whole. Start taking on: September 12, 2021   atorvastatin 10 MG tablet Commonly known as: LIPITOR Take 10 mg by mouth daily.   carvedilol 6.25 MG tablet Commonly known as: COREG Take 1 tablet (6.25 mg total) by mouth 2 (two) times daily with a meal.   fenofibrate 145 MG tablet Commonly known as: TRICOR Take 145 mg by mouth daily.   furosemide 40 MG tablet Commonly known as: LASIX Take 1 tablet (40 mg total) by mouth every Monday, Wednesday, and Friday.   hydrALAZINE 25 MG tablet Commonly known as: APRESOLINE Take 1 tablet (25 mg total) by mouth 2 (two) times daily.   isosorbide dinitrate 20 MG tablet Commonly known as: ISORDIL Take 1 tablet (20 mg total) by mouth 2 (two) times daily.   latanoprost 0.005 % ophthalmic solution Commonly known as: XALATAN Place 1 drop into both eyes daily.   omeprazole 20 MG capsule Commonly known as: PRILOSEC Take 20 mg by mouth every other day.   ondansetron 4 MG disintegrating tablet Commonly known as: ZOFRAN-ODT Take 4 mg by mouth every 8 (eight) hours as needed for nausea or vomiting.   ProAir HFA 108 (90 Base) MCG/ACT inhaler Generic drug: albuterol Inhale 2 puffs into the lungs 4 (four) times daily as needed.   sitaGLIPtin 100 MG tablet Commonly known as: Januvia Take 0.5 tablets (50 mg total) by mouth daily. What changed: how much to take   tizanidine 2 MG capsule Commonly known as: ZANAFLEX Take 2 mg by mouth at bedtime.        Follow-up Information     Dixie Dials, MD Follow up in 1 week(s).   Specialty: Cardiology Contact information: [place] [address] [phone]                 Time spent: Review of old chart, current chart, lab, x-ray, cardiac tests and discussion with patient over 60 minutes.  Signed: [name] 09/11/2021, 4:33 PM

## 2021-09-11 NOTE — Progress Notes (Signed)
  Echocardiogram 2D Echocardiogram with defintiy has been performed.  Darlina Sicilian M 09/11/2021, 2:14 PM

## 2021-12-23 ENCOUNTER — Inpatient Hospital Stay (HOSPITAL_COMMUNITY)
Admission: EM | Admit: 2021-12-23 | Discharge: 2021-12-26 | DRG: 291 | Disposition: A | Discharge status: Home / Self Care | Payer: Medicare HMO | Attending: Cardiovascular Disease | Admitting: Cardiovascular Disease

## 2021-12-23 ENCOUNTER — Encounter (HOSPITAL_COMMUNITY): Payer: Self-pay | Admitting: Emergency Medicine

## 2021-12-23 ENCOUNTER — Emergency Department (HOSPITAL_COMMUNITY): Payer: Medicare HMO

## 2021-12-23 DIAGNOSIS — Z20822 Contact with and (suspected) exposure to covid-19: Secondary | ICD-10-CM | POA: Diagnosis present

## 2021-12-23 DIAGNOSIS — M1990 Unspecified osteoarthritis, unspecified site: Secondary | ICD-10-CM | POA: Diagnosis present

## 2021-12-23 DIAGNOSIS — I340 Nonrheumatic mitral (valve) insufficiency: Secondary | ICD-10-CM | POA: Diagnosis present

## 2021-12-23 DIAGNOSIS — Z6831 Body mass index (BMI) 31.0-31.9, adult: Secondary | ICD-10-CM

## 2021-12-23 DIAGNOSIS — N184 Chronic kidney disease, stage 4 (severe): Secondary | ICD-10-CM | POA: Diagnosis present

## 2021-12-23 DIAGNOSIS — E785 Hyperlipidemia, unspecified: Secondary | ICD-10-CM | POA: Diagnosis present

## 2021-12-23 DIAGNOSIS — I420 Dilated cardiomyopathy: Secondary | ICD-10-CM | POA: Diagnosis present

## 2021-12-23 DIAGNOSIS — E669 Obesity, unspecified: Secondary | ICD-10-CM | POA: Diagnosis present

## 2021-12-23 DIAGNOSIS — D631 Anemia in chronic kidney disease: Secondary | ICD-10-CM | POA: Diagnosis present

## 2021-12-23 DIAGNOSIS — E1165 Type 2 diabetes mellitus with hyperglycemia: Secondary | ICD-10-CM | POA: Diagnosis present

## 2021-12-23 DIAGNOSIS — I130 Hypertensive heart and chronic kidney disease with heart failure and stage 1 through stage 4 chronic kidney disease, or unspecified chronic kidney disease: Secondary | ICD-10-CM | POA: Diagnosis not present

## 2021-12-23 DIAGNOSIS — N281 Cyst of kidney, acquired: Secondary | ICD-10-CM | POA: Diagnosis present

## 2021-12-23 DIAGNOSIS — Z91119 Patient's noncompliance with dietary regimen due to unspecified reason: Secondary | ICD-10-CM

## 2021-12-23 DIAGNOSIS — I5023 Acute on chronic systolic (congestive) heart failure: Secondary | ICD-10-CM | POA: Diagnosis present

## 2021-12-23 DIAGNOSIS — E1122 Type 2 diabetes mellitus with diabetic chronic kidney disease: Secondary | ICD-10-CM | POA: Diagnosis present

## 2021-12-23 DIAGNOSIS — R0602 Shortness of breath: Secondary | ICD-10-CM | POA: Diagnosis not present

## 2021-12-23 DIAGNOSIS — F17220 Nicotine dependence, chewing tobacco, uncomplicated: Secondary | ICD-10-CM | POA: Diagnosis present

## 2021-12-23 DIAGNOSIS — N179 Acute kidney failure, unspecified: Secondary | ICD-10-CM

## 2021-12-23 DIAGNOSIS — N1330 Unspecified hydronephrosis: Secondary | ICD-10-CM | POA: Diagnosis present

## 2021-12-23 HISTORY — DX: Nonrheumatic mitral (valve) insufficiency: I34.0

## 2021-12-23 HISTORY — DX: Other cardiomyopathies: I42.8

## 2021-12-23 HISTORY — DX: Chronic kidney disease, stage 4 (severe): N18.4

## 2021-12-23 HISTORY — DX: Heart failure, unspecified: I50.9

## 2021-12-23 HISTORY — DX: Morbid (severe) obesity due to excess calories: E66.01

## 2021-12-23 HISTORY — DX: Anemia, unspecified: D64.9

## 2021-12-23 HISTORY — DX: Hyperlipidemia, unspecified: E78.5

## 2021-12-23 LAB — CBC
HCT: 32.5 % — ABNORMAL LOW (ref 36.0–46.0)
Hemoglobin: 10.3 g/dL — ABNORMAL LOW (ref 12.0–15.0)
MCH: 28.1 pg (ref 26.0–34.0)
MCHC: 31.7 g/dL (ref 30.0–36.0)
MCV: 88.8 fL (ref 80.0–100.0)
Platelets: 150 10*3/uL (ref 150–400)
RBC: 3.66 MIL/uL — ABNORMAL LOW (ref 3.87–5.11)
RDW: 15.4 % (ref 11.5–15.5)
WBC: 9.8 10*3/uL (ref 4.0–10.5)
nRBC: 0 % (ref 0.0–0.2)

## 2021-12-23 LAB — BASIC METABOLIC PANEL
Anion gap: 13 (ref 5–15)
BUN: 45 mg/dL — ABNORMAL HIGH (ref 8–23)
CO2: 22 mmol/L (ref 22–32)
Calcium: 10 mg/dL (ref 8.9–10.3)
Chloride: 106 mmol/L (ref 98–111)
Creatinine, Ser: 2.84 mg/dL — ABNORMAL HIGH (ref 0.44–1.00)
GFR, Estimated: 17 mL/min — ABNORMAL LOW (ref >60)
Glucose, Bld: 120 mg/dL — ABNORMAL HIGH (ref 70–99)
Potassium: 4.5 mmol/L (ref 3.5–5.1)
Sodium: 141 mmol/L (ref 135–145)

## 2021-12-23 LAB — BRAIN NATRIURETIC PEPTIDE: B Natriuretic Peptide: >4500 pg/mL — ABNORMAL HIGH (ref 0.0–100.0)

## 2021-12-23 NOTE — ED Provider Triage Note (Signed)
Emergency Medicine Provider Triage Evaluation Note  Dana Walters , a 76 y.o. female  was evaluated in triage.  Pt complains of increased shortness of breath starting yesterday.  Patient has a history of CHF.  She reports overall trouble catching her breath since October, but significantly worse yesterday.  She used her daughter's oxygen at home earlier today, and had improvement.  She has not noticed any knee swelling.  Review of Systems  Positive: Shortness of breath, dry cough, nausea Negative: Fever, chills, chest pain, abdominal pain, vomiting  Physical Exam  BP (!) 164/112 (BP Location: Left Arm)    Pulse (!) 105    Temp 99 F (37.2 C) (Oral)    Resp 20    SpO2 99%  Gen:   Awake, no distress   Resp:  Normal effort  MSK:   Moves extremities without difficulty  Other:    Medical Decision Making  Medically screening exam initiated at 6:08 PM.  Appropriate orders placed.  [name] was informed that the remainder of the evaluation will be completed by another provider, this initial triage assessment does not replace that evaluation, and the importance of remaining in the ED until their evaluation is complete.     [name], PA-C 12/23/21 1809

## 2021-12-23 NOTE — ED Triage Notes (Signed)
Pt reports trouble catching her breath since October but worsened yesterday. Hx of CHF. Endorses left side pain. States she has not noticed any new swelling.

## 2021-12-24 ENCOUNTER — Other Ambulatory Visit: Payer: Self-pay

## 2021-12-24 DIAGNOSIS — N184 Chronic kidney disease, stage 4 (severe): Secondary | ICD-10-CM | POA: Diagnosis present

## 2021-12-24 DIAGNOSIS — E1165 Type 2 diabetes mellitus with hyperglycemia: Secondary | ICD-10-CM | POA: Diagnosis present

## 2021-12-24 DIAGNOSIS — I130 Hypertensive heart and chronic kidney disease with heart failure and stage 1 through stage 4 chronic kidney disease, or unspecified chronic kidney disease: Secondary | ICD-10-CM | POA: Diagnosis present

## 2021-12-24 DIAGNOSIS — M1990 Unspecified osteoarthritis, unspecified site: Secondary | ICD-10-CM | POA: Diagnosis present

## 2021-12-24 DIAGNOSIS — Z6831 Body mass index (BMI) 31.0-31.9, adult: Secondary | ICD-10-CM | POA: Diagnosis not present

## 2021-12-24 DIAGNOSIS — I5023 Acute on chronic systolic (congestive) heart failure: Secondary | ICD-10-CM | POA: Diagnosis present

## 2021-12-24 DIAGNOSIS — E785 Hyperlipidemia, unspecified: Secondary | ICD-10-CM | POA: Diagnosis present

## 2021-12-24 DIAGNOSIS — I340 Nonrheumatic mitral (valve) insufficiency: Secondary | ICD-10-CM | POA: Diagnosis present

## 2021-12-24 DIAGNOSIS — E1122 Type 2 diabetes mellitus with diabetic chronic kidney disease: Secondary | ICD-10-CM | POA: Diagnosis present

## 2021-12-24 DIAGNOSIS — E669 Obesity, unspecified: Secondary | ICD-10-CM | POA: Diagnosis present

## 2021-12-24 DIAGNOSIS — Z20822 Contact with and (suspected) exposure to covid-19: Secondary | ICD-10-CM | POA: Diagnosis present

## 2021-12-24 DIAGNOSIS — F17220 Nicotine dependence, chewing tobacco, uncomplicated: Secondary | ICD-10-CM | POA: Diagnosis present

## 2021-12-24 DIAGNOSIS — D631 Anemia in chronic kidney disease: Secondary | ICD-10-CM | POA: Diagnosis present

## 2021-12-24 DIAGNOSIS — N281 Cyst of kidney, acquired: Secondary | ICD-10-CM | POA: Diagnosis present

## 2021-12-24 DIAGNOSIS — R0602 Shortness of breath: Secondary | ICD-10-CM | POA: Diagnosis present

## 2021-12-24 DIAGNOSIS — Z91119 Patient's noncompliance with dietary regimen due to unspecified reason: Secondary | ICD-10-CM | POA: Diagnosis not present

## 2021-12-24 DIAGNOSIS — N1330 Unspecified hydronephrosis: Secondary | ICD-10-CM | POA: Diagnosis present

## 2021-12-24 DIAGNOSIS — I420 Dilated cardiomyopathy: Secondary | ICD-10-CM | POA: Diagnosis present

## 2021-12-24 LAB — RESP PANEL BY RT-PCR (FLU A&B, COVID) ARPGX2
Influenza A by PCR: NEGATIVE
Influenza B by PCR: NEGATIVE
SARS Coronavirus 2 by RT PCR: NEGATIVE

## 2021-12-24 LAB — GLUCOSE, CAPILLARY: Glucose-Capillary: 182 mg/dL — ABNORMAL HIGH (ref 70–99)

## 2021-12-24 MED ORDER — LATANOPROST 0.005 % OP SOLN
1.0000 [drp] | Freq: Every day | OPHTHALMIC | Status: DC
  Administered 2021-12-24 – 2021-12-25 (×2): 1 [drp] via OPHTHALMIC
  Filled 2021-12-24: qty 2.5

## 2021-12-24 MED ORDER — FUROSEMIDE 10 MG/ML IJ SOLN
40.0000 mg | Freq: Two times a day (BID) | INTRAMUSCULAR | Status: DC
  Administered 2021-12-24: 17:00:00 40 mg via INTRAVENOUS
  Filled 2021-12-24: qty 4

## 2021-12-24 MED ORDER — ONDANSETRON HCL 4 MG/2ML IJ SOLN: 4.0000 mg | Freq: Four times a day (QID) | INTRAMUSCULAR | Status: DC | PRN

## 2021-12-24 MED ORDER — FENOFIBRATE 160 MG PO TABS
160.0000 mg | ORAL_TABLET | Freq: Every day | ORAL | Status: DC
  Administered 2021-12-24 – 2021-12-26 (×3): 160 mg via ORAL
  Filled 2021-12-24 (×3): qty 1

## 2021-12-24 MED ORDER — PANTOPRAZOLE SODIUM 40 MG PO TBEC
40.0000 mg | DELAYED_RELEASE_TABLET | Freq: Every day | ORAL | Status: DC
  Administered 2021-12-24 – 2021-12-26 (×3): 40 mg via ORAL
  Filled 2021-12-24 (×3): qty 1

## 2021-12-24 MED ORDER — HYDRALAZINE HCL 25 MG PO TABS
25.0000 mg | ORAL_TABLET | Freq: Two times a day (BID) | ORAL | Status: DC
  Administered 2021-12-24 – 2021-12-25 (×2): 25 mg via ORAL
  Filled 2021-12-24 (×3): qty 1

## 2021-12-24 MED ORDER — ALBUTEROL SULFATE (2.5 MG/3ML) 0.083% IN NEBU: 2.5000 mg | INHALATION_SOLUTION | Freq: Four times a day (QID) | RESPIRATORY_TRACT | Status: DC | PRN

## 2021-12-24 MED ORDER — ONDANSETRON 4 MG PO TBDP
4.0000 mg | ORAL_TABLET | Freq: Three times a day (TID) | ORAL | Status: DC | PRN
  Filled 2021-12-24: qty 1

## 2021-12-24 MED ORDER — ASPIRIN EC 81 MG PO TBEC
81.0000 mg | DELAYED_RELEASE_TABLET | Freq: Every day | ORAL | Status: DC
  Administered 2021-12-24 – 2021-12-26 (×3): 81 mg via ORAL
  Filled 2021-12-24 (×2): qty 1

## 2021-12-24 MED ORDER — HEPARIN SODIUM (PORCINE) 5000 UNIT/ML IJ SOLN
5000.0000 [IU] | Freq: Three times a day (TID) | INTRAMUSCULAR | Status: DC
  Administered 2021-12-24 – 2021-12-26 (×5): 5000 [IU] via SUBCUTANEOUS
  Filled 2021-12-24 (×6): qty 1

## 2021-12-24 MED ORDER — SODIUM CHLORIDE 0.9% FLUSH
3.0000 mL | Freq: Two times a day (BID) | INTRAVENOUS | Status: DC
  Administered 2021-12-24 – 2021-12-26 (×5): 3 mL via INTRAVENOUS

## 2021-12-24 MED ORDER — FUROSEMIDE 10 MG/ML IJ SOLN
40.0000 mg | Freq: Two times a day (BID) | INTRAMUSCULAR | Status: DC
  Administered 2021-12-25 – 2021-12-26 (×3): 40 mg via INTRAVENOUS
  Filled 2021-12-24 (×3): qty 4

## 2021-12-24 MED ORDER — CARVEDILOL 6.25 MG PO TABS
6.2500 mg | ORAL_TABLET | Freq: Two times a day (BID) | ORAL | Status: DC
  Administered 2021-12-24 – 2021-12-26 (×4): 6.25 mg via ORAL
  Filled 2021-12-24: qty 2
  Filled 2021-12-24 (×4): qty 1

## 2021-12-24 MED ORDER — ASPIRIN EC 81 MG PO TBEC
81.0000 mg | DELAYED_RELEASE_TABLET | Freq: Every day | ORAL | Status: DC
  Filled 2021-12-24: qty 1

## 2021-12-24 MED ORDER — SODIUM CHLORIDE 0.9% FLUSH: 3.0000 mL | INTRAVENOUS | Status: DC | PRN

## 2021-12-24 MED ORDER — ISOSORBIDE DINITRATE 10 MG PO TABS
20.0000 mg | ORAL_TABLET | Freq: Two times a day (BID) | ORAL | Status: DC
  Administered 2021-12-24 – 2021-12-26 (×4): 20 mg via ORAL
  Filled 2021-12-24 (×3): qty 2
  Filled 2021-12-24: qty 1

## 2021-12-24 MED ORDER — SODIUM CHLORIDE 0.9 % IV SOLN: 250.0000 mL | INTRAVENOUS | Status: DC | PRN

## 2021-12-24 MED ORDER — ORAL CARE MOUTH RINSE
15.0000 mL | Freq: Two times a day (BID) | OROMUCOSAL | Status: DC
  Administered 2021-12-25 – 2021-12-26 (×3): 15 mL via OROMUCOSAL

## 2021-12-24 MED ORDER — ALLOPURINOL 100 MG PO TABS
100.0000 mg | ORAL_TABLET | Freq: Every day | ORAL | Status: DC
  Administered 2021-12-25 – 2021-12-26 (×2): 100 mg via ORAL
  Filled 2021-12-24 (×2): qty 1

## 2021-12-24 MED ORDER — ACETAMINOPHEN-CODEINE #3 300-30 MG PO TABS
1.0000 | ORAL_TABLET | Freq: Two times a day (BID) | ORAL | Status: DC
  Administered 2021-12-24 – 2021-12-26 (×4): 1 via ORAL
  Filled 2021-12-24 (×4): qty 1

## 2021-12-24 MED ORDER — LINAGLIPTIN 5 MG PO TABS
5.0000 mg | ORAL_TABLET | Freq: Every day | ORAL | Status: DC
  Administered 2021-12-25 – 2021-12-26 (×2): 5 mg via ORAL
  Filled 2021-12-24 (×3): qty 1

## 2021-12-24 MED ORDER — ATORVASTATIN CALCIUM 10 MG PO TABS
10.0000 mg | ORAL_TABLET | Freq: Every day | ORAL | Status: DC
  Administered 2021-12-24 – 2021-12-26 (×3): 10 mg via ORAL
  Filled 2021-12-24 (×3): qty 1

## 2021-12-24 MED ORDER — TIZANIDINE HCL 4 MG PO TABS
2.0000 mg | ORAL_TABLET | Freq: Every day | ORAL | Status: DC
  Administered 2021-12-25: 2 mg via ORAL
  Filled 2021-12-24 (×2): qty 1

## 2021-12-24 NOTE — Progress Notes (Signed)
Heart Failure Nurse Navigator Progress Note  Following this hospitalization to assess for HV TOC readiness. Monitoring renal function improvement, CKD IV. 2 HF related hospitalizations x 6 months. ?functional status.   EF: 25-30%, G1DD, mod MR.   Pricilla Holm, MSN, RN Heart Failure Nurse Navigator [phone]

## 2021-12-24 NOTE — H&P (Signed)
Referring Physician:  DEEPA BARTHEL is an 76 y.o. female.                       Chief Complaint: Shortness of breath  HPI: 76 years old black female with nonischemic dilated cardiomyopathy, moderate MR, HTN, type 2 DM, CKD IV, HLD, Obesity and anemia of chronic disease has recurrence of  chronic left systolic HF. She had shortness of breath. She is feeling better with supplemental oxygen borrowed from her daughter. She denies fever, chills, chest pain or nausea and vomiting, Her BNP is elevated to over 4500 picogram. Troponin I is normal x 2.. Her chest x-ray is positive for pulmonary edema. EKG shows sinus tachycardia, IVCD with LVH and lateral ischemia.  Past Medical History:  Diagnosis Date   Arthritis    Diabetes mellitus without complication ([place])    Hypertension       Past Surgical History:  Procedure Laterality Date   ACHILLES TENDON SURGERY     KNEE SURGERY     LEFT AND RIGHT HEART CATHETERIZATION WITH CORONARY ANGIOGRAM N/A 11/22/2014   Procedure: LEFT AND RIGHT HEART CATHETERIZATION WITH CORONARY ANGIOGRAM;  Surgeon: [name], MD;  Location: [facility];  Service: Cardiovascular;  Laterality: N/A;    No family history on file. Social History:  reports that she has never smoked. Her smokeless tobacco use includes snuff. She reports that she does not drink alcohol and does not use drugs.  Allergies:  Allergies  Allergen Reactions   Percocet [Oxycodone-Acetaminophen] Itching   Sular [Nisoldipine Er] Itching    Pt has taken amlodipine in the past and tolerated it    (Not in a hospital admission)   Results for orders placed or performed during the hospital encounter of 12/23/21 (from the past 48 hour(s))  Basic metabolic panel     Status: Abnormal   Collection Time: 12/23/21  6:16 PM  Result Value Ref Range   Sodium 141 135 - 145 mmol/L   Potassium 4.5 3.5 - 5.1 mmol/L   Chloride 106 98 - 111 mmol/L   CO2 22 22 - 32 mmol/L   Glucose, Bld 120 (H) 70 - 99 mg/dL     Comment: Glucose reference range applies only to samples taken after fasting for at least 8 hours.   BUN 45 (H) 8 - 23 mg/dL    Comment: REPEATED TO VERIFY   Creatinine, Ser 2.84 (H) 0.44 - 1.00 mg/dL   Calcium 10.0 8.9 - 10.3 mg/dL   GFR, Estimated 17 (L) >60 mL/min    Comment: (NOTE) Calculated using the CKD-EPI Creatinine Equation (2021)    Anion gap 13 5 - 15    Comment: Performed at [facility] [address], [place], [place] [address]  CBC     Status: Abnormal   Collection Time: 12/23/21  6:16 PM  Result Value Ref Range   WBC 9.8 4.0 - 10.5 K/uL   RBC 3.66 (L) 3.87 - 5.11 MIL/uL   Hemoglobin 10.3 (L) 12.0 - 15.0 g/dL   HCT 32.5 (L) 36.0 - 46.0 %   MCV 88.8 80.0 - 100.0 fL   MCH 28.1 26.0 - 34.0 pg   MCHC 31.7 30.0 - 36.0 g/dL   RDW 15.4 11.5 - 15.5 %   Platelets 150 150 - 400 K/uL    Comment: REPEATED TO VERIFY   nRBC 0.0 0.0 - 0.2 %    Comment: Performed at [facility], [place]  [address], [place], [place] [address]  Brain natriuretic peptide     Status: Abnormal   Collection Time: 12/23/21  6:16 PM  Result Value Ref Range   B Natriuretic Peptide >4,500.0 (H) 0.0 - 100.0 pg/mL    Comment: Performed at [facility] [address], [place], [place] [address]   DG Chest 2 View  Result Date: 12/23/2021 CLINICAL DATA:  Worsening shortness of breath.  History of CHF. EXAM: CHEST - 2 VIEW COMPARISON:  September 07, 2021 FINDINGS: Similar cardiomegaly and central vascular prominence. Pulmonary edema with small left greater than right bilateral pleural effusions. Thoracic spondylosis with degenerative changes bilateral glenohumeral joints. IMPRESSION: Cardiomegaly with pulmonary edema and bilateral pleural effusions consistent with CHF exacerbation. Electronically Signed   By: Dahlia Bailiff M.D.   On: 12/23/2021 18:41    Review Of Systems Constitutional: No fever, chills, weight loss or gain. Eyes: No vision change, wears glasses. No discharge or pain. Ears:  No hearing loss, No tinnitus. Respiratory: Positive asthma, COPD, pneumonias, shortness of breath. No hemoptysis. Cardiovascular: Positive chest pain, palpitation, leg edema. Gastrointestinal: No nausea, vomiting, diarrhea, constipation. No GI bleed. No hepatitis. Genitourinary: No dysuria, hematuria, kidney stone. No incontinance. Neurological: Positive headache, no stroke, seizures.  Psychiatry: No psych facility admission for anxiety, depression, suicide. No detox. Skin: No rash. Musculoskeletal: Positive joint pain, fibromyalgia, neck pain, back pain. Lymphadenopathy: No lymphadenopathy. Hematology: Positive anemia or easy bruising.   Blood pressure (!) 134/91, pulse 88, temperature 98.2 F (36.8 C), temperature source Oral, resp. rate 16, SpO2 100 %. There is no height or weight on file to calculate BMI. General appearance: alert, cooperative, appears stated age and moderate respiratory distress post 2L/Minoxygen by  Head: Normocephalic, atraumatic. Eyes: Brown eyes, pink conjunctiva, corneas clear. . Neck: No adenopathy, no carotid bruit, no JVD, supple, symmetrical, trachea midline and thyroid not enlarged. Resp: Basal crackles to auscultation bilaterally. Cardio: Regular rate and rhythm, S1, S2 normal, II/VI systolic murmur, no click, rub or gallop GI: Soft, non-tender; bowel sounds normal; no organomegaly. Extremities: 1 + edema, no cyanosis or clubbing. Skin: Warm and dry.  Neurologic: Alert and oriented X 3, normal strength. Normal coordination.  Assessment/Plan Acute on chronic left systolic heart failure Dilated nonischemic cardiomyopathy HTN Obesity Type 2 DM CKD, IV  Plan: Admit IV lasix. Home medications  Time spent: Review of old records, Lab, x-rays, EKG, other cardiac tests, examination, discussion with patient/PA/Nurse/Family over 70 minutes.  [name], MD  12/24/2021, 1:17 PM

## 2021-12-25 ENCOUNTER — Inpatient Hospital Stay (HOSPITAL_COMMUNITY): Payer: Medicare HMO

## 2021-12-25 ENCOUNTER — Encounter (HOSPITAL_COMMUNITY): Payer: Self-pay | Admitting: Cardiovascular Disease

## 2021-12-25 LAB — BASIC METABOLIC PANEL
Anion gap: 8 (ref 5–15)
BUN: 57 mg/dL — ABNORMAL HIGH (ref 8–23)
CO2: 28 mmol/L (ref 22–32)
Calcium: 9.4 mg/dL (ref 8.9–10.3)
Chloride: 103 mmol/L (ref 98–111)
Creatinine, Ser: 3.61 mg/dL — ABNORMAL HIGH (ref 0.44–1.00)
GFR, Estimated: 13 mL/min — ABNORMAL LOW (ref >60)
Glucose, Bld: 94 mg/dL (ref 70–99)
Potassium: 4.9 mmol/L (ref 3.5–5.1)
Sodium: 139 mmol/L (ref 135–145)

## 2021-12-25 LAB — URINALYSIS, ROUTINE W REFLEX MICROSCOPIC
Bilirubin Urine: NEGATIVE
Glucose, UA: NEGATIVE mg/dL
Hgb urine dipstick: NEGATIVE
Ketones, ur: NEGATIVE mg/dL
Nitrite: NEGATIVE
Protein, ur: NEGATIVE mg/dL
Specific Gravity, Urine: 1.008 (ref 1.005–1.030)
pH: 5 (ref 5.0–8.0)

## 2021-12-25 LAB — GLUCOSE, CAPILLARY
Glucose-Capillary: 106 mg/dL — ABNORMAL HIGH (ref 70–99)
Glucose-Capillary: 101 mg/dL — ABNORMAL HIGH (ref 70–99)
Glucose-Capillary: 107 mg/dL — ABNORMAL HIGH (ref 70–99)
Glucose-Capillary: 110 mg/dL — ABNORMAL HIGH (ref 70–99)

## 2021-12-25 LAB — PROTEIN / CREATININE RATIO, URINE
Creatinine, Urine: 64.88 mg/dL
Total Protein, Urine: <6 mg/dL

## 2021-12-25 LAB — PHOSPHORUS: Phosphorus: 3.9 mg/dL (ref 2.5–4.6)

## 2021-12-25 LAB — DIGOXIN LEVEL: Digoxin Level: <0.2 ng/mL — ABNORMAL LOW (ref 0.8–2.0)

## 2021-12-25 NOTE — TOC Progression Note (Signed)
Transition of Care [facility]) - Progression Note    Patient Details  Name: Dana Walters MRN: [id number] Date of Birth: 01-23-1946  Transition of Care [facility]) CM/SW Contact  Zenon Mayo, RN Phone Number: 12/25/2021, 9:58 AM  Clinical Narrative:     Transition of Care [facility]) Screening Note   Patient Details  Name: Dana Walters Date of Birth: Jan 22, 1946   Transition of Care [facility]) CM/SW Contact:    Zenon Mayo, RN Phone Number: 12/25/2021, 9:59 AM    Transition of Care Department [facility]) has reviewed patient and no TOC needs have been identified at this time. We will continue to monitor patient advancement through interdisciplinary progression rounds. If new patient transition needs arise, please place a TOC consult.          Expected Discharge Plan and Services                                                 Social Determinants of Health (SDOH) Interventions    Readmission Risk Interventions No flowsheet data found.

## 2021-12-25 NOTE — Progress Notes (Signed)
Mobility Specialist Progress Note:   12/25/21 1325  Mobility  Activity Ambulated with assistance in room  Level of Assistance Independent  Assistive Device None  Distance Ambulated (ft) 28 ft  Activity Response Tolerated well  $Mobility charge 1 Mobility   No complaints of pain. Left in bed with call bell in reach and all needs met.   [facility] Public librarian Phone [phone] Secondary Phone [phone]

## 2021-12-25 NOTE — Progress Notes (Signed)
Ref: Dixie Dials, MD   Subjective:  Awake. Breathing is improving. Leg edema is down. She had good diuresis with IV lasix. Patient admits to dietary non-compliance.  Objective:  Vital Signs in the last 24 hours: Temp:  [97.7 F (36.5 C)-98.8 F (37.1 C)] 97.8 F (36.6 C) (01/26 1528) Pulse Rate:  [74-100] 74 (01/26 1528) Cardiac Rhythm: Normal sinus rhythm (01/26 0700) Resp:  [16-23] 18 (01/26 1528) BP: (110-149)/(71-109) 111/80 (01/26 1528) SpO2:  [96 %-100 %] 100 % (01/26 1528) Weight:  [76.7 kg-76.8 kg] 76.7 kg (01/26 0600)  Physical Exam: BP Readings from Last 1 Encounters:  12/25/21 111/80     Wt Readings from Last 1 Encounters:  12/25/21 76.7 kg    Weight change:  Body mass index is 31.93 kg/m. HEENT: [place]/AT, Eyes-Brown, Conjunctiva-Pink, Sclera-Non-icteric Neck: No JVD, No bruit, Trachea midline. Lungs:  Clearing, Bilateral. Cardiac:  Regular rhythm, normal S1 and S2, no S3. II/VI systolic murmur. Abdomen:  Soft, non-tender. BS present. Extremities:  Trace edema present. No cyanosis. No clubbing. CNS: AxOx3, Cranial nerves grossly intact, moves all 4 extremities.  Skin: Warm and dry.   Intake/Output from previous day: 01/25 0701 - 01/26 0700 In: 240 [P.O.:240] Out: 1800 [Urine:1800]    Lab Results: BMET    Component Value Date/Time   NA 139 12/25/2021 0354   NA 141 12/23/2021 1816   NA 138 09/11/2021 0109   K 4.9 12/25/2021 0354   K 4.5 12/23/2021 1816   K 4.7 09/11/2021 0109   CL 103 12/25/2021 0354   CL 106 12/23/2021 1816   CL 106 09/11/2021 0109   CO2 28 12/25/2021 0354   CO2 22 12/23/2021 1816   CO2 24 09/11/2021 0109   GLUCOSE 94 12/25/2021 0354   GLUCOSE 120 (H) 12/23/2021 1816   GLUCOSE 158 (H) 09/11/2021 0109   BUN 57 (H) 12/25/2021 0354   BUN 45 (H) 12/23/2021 1816   BUN 68 (H) 09/11/2021 0109   CREATININE 3.61 (H) 12/25/2021 0354   CREATININE 2.84 (H) 12/23/2021 1816   CREATININE 3.17 (H) 09/11/2021 0109   CALCIUM 9.4 12/25/2021  0354   CALCIUM 10.0 12/23/2021 1816   CALCIUM 9.5 09/11/2021 0109   GFRNONAA 13 (L) 12/25/2021 0354   GFRNONAA 17 (L) 12/23/2021 1816   GFRNONAA 15 (L) 09/11/2021 0109   GFRAA 34 (L) 11/27/2014 0506   GFRAA 29 (L) 11/26/2014 0615   GFRAA 20 (L) 11/25/2014 0822   CBC    Component Value Date/Time   WBC 9.8 12/23/2021 1816   RBC 3.66 (L) 12/23/2021 1816   HGB 10.3 (L) 12/23/2021 1816   HCT 32.5 (L) 12/23/2021 1816   PLT 150 12/23/2021 1816   MCV 88.8 12/23/2021 1816   MCH 28.1 12/23/2021 1816   MCHC 31.7 12/23/2021 1816   RDW 15.4 12/23/2021 1816   LYMPHSABS 2.4 09/07/2021 0338   MONOABS 0.9 09/07/2021 0338   EOSABS 0.2 09/07/2021 0338   BASOSABS 0.1 09/07/2021 0338   HEPATIC Function Panel Recent Labs    09/07/21 0338  PROT 7.0   HEMOGLOBIN A1C No components found for: HGA1C,  MPG CARDIAC ENZYMES Lab Results  Component Value Date   TROPONINI 0.15 (H) 11/22/2014   TROPONINI 0.17 (H) 11/21/2014   TROPONINI 0.17 (H) 11/21/2014   BNP No results for input(s): PROBNP in the last 8760 hours. TSH No results for input(s): TSH in the last 8760 hours. CHOLESTEROL Recent Labs    09/11/21 1756  CHOL 123    Scheduled Meds:  acetaminophen-codeine  1 tablet Oral BID   allopurinol  100 mg Oral Daily   aspirin EC  81 mg Oral Daily   atorvastatin  10 mg Oral Daily   carvedilol  6.25 mg Oral BID WC   fenofibrate  160 mg Oral Daily   furosemide  40 mg Intravenous Q12H   heparin  5,000 Units Subcutaneous Q8H   hydrALAZINE  25 mg Oral BID   isosorbide dinitrate  20 mg Oral BID   latanoprost  1 drop Both Eyes QHS   linagliptin  5 mg Oral Daily   mouth rinse  15 mL Mouth Rinse BID   pantoprazole  40 mg Oral Daily   sodium chloride flush  3 mL Intravenous Q12H   tiZANidine  2 mg Oral QHS   Continuous Infusions:  sodium chloride     PRN Meds:.sodium chloride, albuterol, ondansetron (ZOFRAN) IV, ondansetron, sodium chloride flush  Assessment/Plan:  Acute on chronic left  systolic heart failure Dilated nonischemic cardiomyopathy CKD, V HTN Obesity Type 2 DM  Plan: Renal consult. Continue diuresis. Convert to po torsemide from tomorrow. Discussed decreasing salt intake and cutting back on high calorie food.   LOS: 1 day   Time spent including chart review, lab review, examination, discussion with patient/Daughter/Renal doctor : 30 min   Dixie Dials  MD  12/25/2021, 5:14 PM

## 2021-12-25 NOTE — Consult Note (Signed)
Nephrology Consult   Requesting provider: Dixie Dials Service requesting consult: Cardiology Reason for consult: CKD   Assessment/Recommendations: Dana Walters is a/an 76 y.o. 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 Likely DKD, arterionephrosclerosis, and chronic cardiorenal syndrome. May have some component of AKI at this time but likely mostly CKD. No acute indication for dialysis but will continue discussions based on her progress -Diuretic and heart failure mgmt per primary -Continue to monitor daily Cr, Dose meds for GFR -Monitor Daily I/Os, Daily weight  -Maintain MAP>65 for optimal renal perfusion.  -Avoid nephrotoxic medications including NSAIDs and Vanc/Zosyn combo -Check Renal U/S, UA, and UPCR -Screening for bone health markers with PTH and phos -Currently no indication for HD -will need follow up in the outpatient setting  Acute Heart Failure Exacerbation: Most recent EF 25-30%. Non-ischemic. currently with volume overload. Diuretic management per cardiology. Monitor electrolytes and Crt closely.  Hypertension: BP lower side currently on hydral, imdur, and coreg  Anemia: hgb 10.3. Likely multifactorial. CTM  Uncontrolled Diabetes Mellitus Type 2 with Hyperglycemia: mgmt per primary. On januvia. Most recent a1c I see is 5.2.  Complex cyst: visualized on MRI in 2017. Unclear if this was followed up. Will get repeat RUS. MRI with contrast may be prohibited given her GFR    Recommendations conveyed to primary service.    [facility] 12/25/2021 11:51 AM   _____________________________________________________________________________________ CC: CKD IV  History of Present Illness: Dana Walters is a/an 76 y.o. female with a past medical history of nonischemic  dilated cardiomyopathy, HTN, DM 2, CKD 4, HLD, obesity who presents with sshortness of breath.  Patient presented to the hospital yesterday shortness of breath that was thought to be related to volume overload.  She states that the shortness of breath have been getting worse fairly quickly about a day before she presented.  She also had some orthopnea as well as dyspnea on exertion.  No chest pain, fevers, chills, nausea, vomiting, diarrhea.  She denies NSAID use.  No family history of kidney disease that she is aware of.  No family members on dialysis.  In the emergency department she was placed on oxygen which helped her symptoms.  She was found to have an elevated BNP.  Chest x-ray demonstrated pulmonary edema.   The patient has a long-standing history of elevated creatinine.  It is notable that her creatinine was elevated with a baseline around 1.7 back in 2015. Laboratory data is fairly scarce but in October 2022 her creatinine was 2.7-3.4.  Seems a creatinine has continued fluctuate around these numbers most recently 3.6 today. Urinalysis from October was negative for protein.  She has a history of mildly complex cyst monitored on an MRI in 2017.  It was recommended that she have repeat MRI within the year but I do not see any repeat imaging.  Medications:  Current Facility-Administered Medications  Medication Dose Route Frequency Provider Last Rate Last Admin   0.9 %  sodium chloride infusion  250 mL Intravenous PRN Dixie Dials, MD       acetaminophen-codeine (TYLENOL #3) 300-30 MG per tablet 1 tablet  1 tablet Oral BID Dixie Dials, MD   1 tablet at 12/25/21 0817   albuterol (PROVENTIL) (2.5 MG/3ML) 0.083% nebulizer solution 2.5 mg  2.5 mg Inhalation QID PRN Dixie Dials,  MD       allopurinol (ZYLOPRIM) tablet 100 mg  100 mg Oral Daily Dixie Dials, MD   100 mg at 12/25/21 3474   aspirin EC tablet 81 mg  81 mg Oral Daily Dixie Dials, MD   81 mg at 12/25/21 0816   atorvastatin  (LIPITOR) tablet 10 mg  10 mg Oral Daily Dixie Dials, MD   10 mg at 12/25/21 0815   carvedilol (COREG) tablet 6.25 mg  6.25 mg Oral BID WC Dixie Dials, MD   6.25 mg at 12/25/21 0815   fenofibrate tablet 160 mg  160 mg Oral Daily Dixie Dials, MD   160 mg at 12/25/21 0817   furosemide (LASIX) injection 40 mg  40 mg Intravenous Q12H Dixie Dials, MD   40 mg at 12/25/21 2595   heparin injection 5,000 Units  5,000 Units Subcutaneous Q8H Dixie Dials, MD   5,000 Units at 12/25/21 6387   hydrALAZINE (APRESOLINE) tablet 25 mg  25 mg Oral BID Dixie Dials, MD   25 mg at 12/25/21 0815   isosorbide dinitrate (ISORDIL) tablet 20 mg  20 mg Oral BID Dixie Dials, MD   20 mg at 12/25/21 0816   latanoprost (XALATAN) 0.005 % ophthalmic solution 1 drop  1 drop Both Eyes QHS Dixie Dials, MD   1 drop at 12/24/21 2200   linagliptin (TRADJENTA) tablet 5 mg  5 mg Oral Daily Dixie Dials, MD   5 mg at 12/25/21 0816   MEDLINE mouth rinse  15 mL Mouth Rinse BID Dixie Dials, MD   15 mL at 12/25/21 0820   ondansetron (ZOFRAN) injection 4 mg  4 mg Intravenous Q6H PRN Dixie Dials, MD       ondansetron (ZOFRAN-ODT) disintegrating tablet 4 mg  4 mg Oral Q8H PRN Dixie Dials, MD       pantoprazole (PROTONIX) EC tablet 40 mg  40 mg Oral Daily Dixie Dials, MD   40 mg at 12/25/21 0816   sodium chloride flush (NS) 0.9 % injection 3 mL  3 mL Intravenous Q12H Dixie Dials, MD   3 mL at 12/25/21 0820   sodium chloride flush (NS) 0.9 % injection 3 mL  3 mL Intravenous PRN Dixie Dials, MD       tiZANidine (ZANAFLEX) tablet 2 mg  2 mg Oral QHS Dixie Dials, MD         ALLERGIES Percocet [oxycodone-acetaminophen] and Claris Che er]  MEDICAL HISTORY Past Medical History:  Diagnosis Date   Anemia    Arthritis    CHF (congestive heart failure) ([place])    Chronic kidney disease (CKD), stage IV (severe) ([place])    Diabetes mellitus without complication ([place])    Hyperlipidemia    Hypertension    Mitral  regurgitation    Morbid obesity ([place])    NICM (nonischemic cardiomyopathy) ([place])      SOCIAL HISTORY Social History   Socioeconomic History   Marital status: Widowed    Spouse name: Not on file   Number of children: Not on file   Years of education: Not on file   Highest education level: Not on file  Occupational History   Not on file  Tobacco Use   Smoking status: Never   Smokeless tobacco: Current    Types: Snuff  Substance and Sexual Activity   Alcohol use: No   Drug use: No   Sexual activity: Not on file  Other Topics Concern   Not on file  Social History Narrative   Not  on file   Social Determinants of Health   Financial Resource Strain: Not on file  Food Insecurity: Not on file  Transportation Needs: Not on file  Physical Activity: Not on file  Stress: Not on file  Social Connections: Not on file  Intimate Partner Violence: Not on file     FAMILY HISTORY History reviewed. No pertinent family history.  Family denies a family history of kidney disease.  Unable to relate any other pertinent family history.   Review of Systems: 12 systems reviewed Otherwise as per HPI, all other systems reviewed and negative  Physical Exam: Vitals:   12/25/21 0809 12/25/21 0828  BP: 110/90 110/90  Pulse: 83 83  Resp: 18 18  Temp: 97.9 F (36.6 C) 97.9 F (36.6 C)  SpO2: 100% 100%   Total I/O In: 240 [P.O.:240] Out: 100 [Urine:100]  Intake/Output Summary (Last 24 hours) at 12/25/2021 1151 Last data filed at 12/25/2021 5883 Gross per 24 hour  Intake 480 ml  Output 1900 ml  Net -1420 ml   General: well-appearing, no acute distress HEENT: anicteric sclera, oropharynx clear without lesions CV: regular rate, normal rhythm, no murmurs, no gallops, no rubs, trace peripheral edema in the ankles Lungs: clear to auscultation bilaterally, normal work of breathing Abd: soft, non-tender, non-distended Skin: no visible lesions or rashes Psych: alert, engaged, appropriate  mood and affect Musculoskeletal: no obvious deformities Neuro: normal speech, no gross focal deficits   Test Results Reviewed Lab Results  Component Value Date   NA 139 12/25/2021   K 4.9 12/25/2021   CL 103 12/25/2021   CO2 28 12/25/2021   BUN 57 (H) 12/25/2021   CREATININE 3.61 (H) 12/25/2021   CALCIUM 9.4 12/25/2021   ALBUMIN 3.5 09/07/2021   PHOS 2.9 11/27/2014     I have reviewed all relevant outside healthcare records related to the patient's current hospitalization

## 2021-12-26 LAB — BASIC METABOLIC PANEL
Anion gap: 10 (ref 5–15)
BUN: 67 mg/dL — ABNORMAL HIGH (ref 8–23)
CO2: 26 mmol/L (ref 22–32)
Calcium: 9 mg/dL (ref 8.9–10.3)
Chloride: 100 mmol/L (ref 98–111)
Creatinine, Ser: 4.01 mg/dL — ABNORMAL HIGH (ref 0.44–1.00)
GFR, Estimated: 11 mL/min — ABNORMAL LOW (ref >60)
Glucose, Bld: 111 mg/dL — ABNORMAL HIGH (ref 70–99)
Potassium: 4.1 mmol/L (ref 3.5–5.1)
Sodium: 136 mmol/L (ref 135–145)

## 2021-12-26 LAB — GLUCOSE, CAPILLARY: Glucose-Capillary: 99 mg/dL (ref 70–99)

## 2021-12-26 MED ORDER — TORSEMIDE 20 MG PO TABS: 20.0000 mg | ORAL_TABLET | Freq: Every day | ORAL | 3 refills | Status: AC

## 2021-12-26 MED ORDER — TORSEMIDE 20 MG PO TABS: 20.0000 mg | ORAL_TABLET | Freq: Two times a day (BID) | ORAL | Status: DC

## 2021-12-26 NOTE — TOC Transition Note (Addendum)
Transition of Care [facility]) - CM/SW Discharge Note   Patient Details  Name: Dana Walters MRN: [id number] Date of Birth: August 26, 1946  Transition [facility]) CM/SW Contact:  Zenon Mayo, RN Phone Number: 12/26/2021, 10:56 AM   Clinical Narrative:    From home with daughter, CHF , for dc today, ambulatory sats checked, she does not need oxygen. She states she has canes at home but does not use them, her brother will transport her home.         Patient Goals and CMS Choice        Discharge Placement                       Discharge Plan and Services                                     Social Determinants of Health (SDOH) Interventions     Readmission Risk Interventions No flowsheet data found.

## 2021-12-26 NOTE — Progress Notes (Signed)
Heart Failure Navigator Progress Note  Assessed for Heart & Vascular TOC clinic readiness.  Patient does not meet criteria due to CKD IV, SCr baseline 2.7-3.4. Visited with patient who states she drinks a lot of fluid in the day and also eats food high in sodium. Educated on dietary modifications, pt verbalizes understanding. Pt does not drive, but lives with her 2 daughters and granddaughter who provide transportation.   No plans for HV TOC clinic related to renal function.   Navigator available for reassessment of patient.   Pricilla Holm, MSN, RN Heart Failure Nurse Navigator [phone]

## 2021-12-26 NOTE — Discharge Summary (Signed)
Physician Discharge Summary  Patient ID: Dana Walters MRN: [id number] DOB/AGE: 1946-01-24 76 y.o.  Admit date: 12/23/2021 Discharge date: 12/26/2021  Admission Diagnoses: Acute on chronic left systolic heart failure Dilated nonischemic cardiomyopathy HTN Obesity Type 2 DM CKD, IV  Discharge Diagnoses:  Principal Problem:   Acute on chronic left systolic heart failure (HCC) Active problem:  Dilated nonischemic cardiomyopathy  CKD, IV  Anemia of chronic disease  HTN  HLD  Obesity  Type 2 DM  Discharged Condition: fair  Hospital Course: 76 years old black female with nonischemic dilated cardiomyopathy, moderate MR, HTN, type 2 DM, CKD IV, HLD and obesity has shortness of breath with leg edema with dietary non-compliance. She had IV lasix with fair diuresis.  She had renal consult for stage IV renal dysfunction. Ultrasound of kidneys showed chronic kidney disease with hydronephrosis and unchanged renal cyst. Diet activity and medications were discussed. She will see me in 1 week and renal doctor soon.  Consults: cardiology and nephrology  Significant Diagnostic Studies: labs: Normal WBC and Platelets count. Hgb stable at 10.3 gm. BMET with notmal electrolytes and elevated creatinine of 2.84 on admission and 4.01 mg. on day of discharge. BNP was elevated at 4500 pg. Digoxin level was less than 0.2 ng. Parathyroid hormone level is pending.  EKG: NSR, LVH with QRS widening and lateral ischemia  Chest x-ray: Cardiomegaly and pulmonary edema.  Renal ultrasound: Chronic medical renal disease without obstructive uropathy. Unchanged 2.3 cm cystic lesion since 2017.  Treatments: IV lasix followed by oral torsemide. Discontinuation of hydralazine. Continue aspirin, carvedilol, atorvastatin, Isosorbide and fenofibrate.  Discharge Exam: Blood pressure 107/80, pulse 73, temperature (!) 97.5 F (36.4 C), temperature source Oral, resp. rate 15, height 5\' 1"  (1.549 m), weight 77 kg, SpO2 100  %. General appearance: alert, cooperative and appears stated age. Head: Normocephalic, atraumatic. Eyes: Brown eyes, pale pink conjunctiva, corneas clear. PERRL, EOM's intact.  Neck: No adenopathy, no carotid bruit, no JVD, supple, symmetrical, trachea midline and thyroid not enlarged. Resp: Clear to auscultation bilaterally. Cardio: Regular rate and rhythm, S1, S2 normal, II/VI systolic murmur, no click, rub or gallop. GI: Soft, non-tender; bowel sounds normal; no organomegaly. Extremities: No edema, cyanosis or clubbing. Skin: Warm and dry.  Neurologic: Alert and oriented X 3, normal strength and tone. Normal coordination and slow gait.  Disposition: Discharge disposition: 01-Home or Self Care        Allergies as of 12/26/2021       Reactions   Percocet [oxycodone-acetaminophen] Itching   Sular [nisoldipine Er] Itching   Pt has taken amlodipine in the past and tolerated it        Medication List     STOP taking these medications    furosemide 40 MG tablet Commonly known as: LASIX   hydrALAZINE 25 MG tablet Commonly known as: APRESOLINE       TAKE these medications    Acetaminophen-Codeine 300-30 MG tablet Take 1 tablet by mouth daily as needed for pain.   allopurinol 100 MG tablet Commonly known as: Zyloprim Take 1 tablet (100 mg total) by mouth daily.   aspirin 81 MG EC tablet Take 1 tablet (81 mg total) by mouth daily. Swallow whole.   atorvastatin 10 MG tablet Commonly known as: LIPITOR Take 10 mg by mouth daily.   carvedilol 6.25 MG tablet Commonly known as: COREG Take 1 tablet (6.25 mg total) by mouth 2 (two) times daily with a meal.   fenofibrate 145 MG tablet Commonly known as:  TRICOR Take 145 mg by mouth daily.   isosorbide dinitrate 20 MG tablet Commonly known as: ISORDIL Take 1 tablet (20 mg total) by mouth 2 (two) times daily.   latanoprost 0.005 % ophthalmic solution Commonly known as: XALATAN Place 1 drop into both eyes at  bedtime.   omeprazole 20 MG capsule Commonly known as: PRILOSEC Take 20 mg by mouth daily as needed (heartburn).   ondansetron 4 MG disintegrating tablet Commonly known as: ZOFRAN-ODT Take 4 mg by mouth every 8 (eight) hours as needed for nausea or vomiting.   ProAir HFA 108 (90 Base) MCG/ACT inhaler Generic drug: albuterol Inhale 2 puffs into the lungs 4 (four) times daily as needed for wheezing or shortness of breath.   sitaGLIPtin 100 MG tablet Commonly known as: Januvia Take 0.5 tablets (50 mg total) by mouth daily. What changed:  how much to take when to take this additional instructions   tizanidine 2 MG capsule Commonly known as: ZANAFLEX Take 2 mg by mouth daily as needed for muscle spasms.   torsemide 20 MG tablet Commonly known as: DEMADEX Take 1 tablet (20 mg total) by mouth daily.        Follow-up Information     Dixie Dials, MD. Go on 01/01/2022.   Specialty: Cardiology Why: @3 :30pm Contact information: [place] [address] [phone]                 Time spent: Review of old chart, current chart, lab, x-ray, cardiac tests and discussion with patient/Nurse/Renal doctor over 60 minutes.  Signed: [name] 12/26/2021, 10:45 AM

## 2021-12-26 NOTE — Progress Notes (Signed)
Subjective:  1000 of UOP -  crt up to 4.0-  renal u/s showed small echogenid kidneys without hydro-  cyst unchanged from 2017-  U/A negative for protein or blood-  looking more like a cardiorenal type picture -  she feels well-  not uremic-  walking back and forth to bathroom without dizziness-  Dr. Doylene Canard is planning on sending her home   Objective Vital signs in last 24 hours: Vitals:   12/25/21 2044 12/26/21 0218 12/26/21 0630 12/26/21 0855  BP: 115/87  117/74 107/80  Pulse:   75 73  Resp:      Temp:   (!) 97.5 F (36.4 C)   TempSrc:   Oral   SpO2:   100% 100%  Weight:  77 kg    Height:       Weight change: 0.161 kg  Intake/Output Summary (Last 24 hours) at 12/26/2021 0932 Last data filed at 12/26/2021 0854 Gross per 24 hour  Intake 720 ml  Output 1000 ml  Net -280 ml    Assessment/ Plan: Pt is a 76 y.o. yo female with EF 25-30% , DM, HTN who was admitted on 12/23/2021 with heart failure exacerbation felt due to dietary indiscretion-  also noted to have  progressive CKD since 2015 Assessment/Plan: 1. Renal-  longstanding it appears and progressive CKD-  now stage 4. Bland U/A argues for cardiorenal syndrome.  In the setting of diuresis but more importantly lower BP her crt has risen but she is not uremic-  her hydralazine has been stopped.  It makes me slightly nervous to send her out with a rising crt but she looks good and I think her renal function will settle-  we can also arrange pretty quick follow up with [place] and cards 2. HTN/vol-   negative 2 liters while here-  symptomatically improved-  plan is to change her over to PO torsemide and sen home-  hydralazine has been stopped-  only on low dose coreg -  volume status improved  3. Anemia-  likely related to her CKD-  needs iron stores and management as OP  4. DM-   well controlled on tradjenta   I am comfortable with discharge-  will arrange OP visit soon at [place] with labs     [place]: Basic  Metabolic Panel: Recent Labs  Lab 12/23/21 1816 12/25/21 0354 12/26/21 0314  NA 141 139 136  K 4.5 4.9 4.1  CL 106 103 100  CO2 22 28 26   GLUCOSE 120* 94 111*  BUN 45* 57* 67*  CREATININE 2.84* 3.61* 4.01*  CALCIUM 10.0 9.4 9.0  PHOS  --  3.9  --    Liver Function Tests: No results for input(s): AST, ALT, ALKPHOS, BILITOT, PROT, ALBUMIN in the last 168 hours. No results for input(s): LIPASE, AMYLASE in the last 168 hours. No results for input(s): AMMONIA in the last 168 hours. CBC: Recent Labs  Lab 12/23/21 1816  WBC 9.8  HGB 10.3*  HCT 32.5*  MCV 88.8  PLT 150   Cardiac Enzymes: No results for input(s): CKTOTAL, CKMB, CKMBINDEX, TROPONINI in the last 168 hours. CBG: Recent Labs  Lab 12/25/21 0612 12/25/21 1131 12/25/21 1636 12/25/21 2122 12/26/21 0617  GLUCAP 106* 101* 107* 110* 99    Iron Studies: No results for input(s): IRON, TIBC, TRANSFERRIN, FERRITIN in the last 72 hours. Studies/Results: US RENAL  Result Date: 12/25/2021 CLINICAL DATA:  Acute kidney injury. EXAM: RENAL / URINARY TRACT ULTRASOUND  COMPLETE COMPARISON:  Abdominal MRI 09/04/2016 FINDINGS: Right Kidney: Renal measurements: 8 x 4.3 x 4.4 cm = volume: 80 mL. Mild increased parenchymal echogenicity. No hydronephrosis. No visualized stone or focal lesion. Left Kidney: Renal measurements: 8.5 x 6 x 4.1 cm = volume: 108 mL. Mild increased parenchymal echogenicity. No hydronephrosis. Cystic lesion in the lower pole measures 2.3 x 2.3 x 2.3 cm, slightly thickened wall. This is unchanged in size from prior MRI, but is otherwise incompletely characterized. Bladder: Near completely empty. Other: None. IMPRESSION: 1. No obstructive uropathy. 2. Mild increased bilateral renal parenchymal echogenicity typical of chronic medical renal disease. 3. Cystic lesion in the lower left kidney measures 2.3 cm, not significantly changed in size from 2017 MRI. Electronically Signed   By: Keith Rake M.D.   On: 12/25/2021  18:23   Medications: Infusions:  sodium chloride      Scheduled Medications:  acetaminophen-codeine  1 tablet Oral BID   allopurinol  100 mg Oral Daily   aspirin EC  81 mg Oral Daily   atorvastatin  10 mg Oral Daily   carvedilol  6.25 mg Oral BID WC   fenofibrate  160 mg Oral Daily   furosemide  40 mg Intravenous Q12H   heparin  5,000 Units Subcutaneous Q8H   isosorbide dinitrate  20 mg Oral BID   latanoprost  1 drop Both Eyes QHS   linagliptin  5 mg Oral Daily   mouth rinse  15 mL Mouth Rinse BID   pantoprazole  40 mg Oral Daily   sodium chloride flush  3 mL Intravenous Q12H   tiZANidine  2 mg Oral QHS    have reviewed scheduled and prn medications.  Physical Exam: General:  obese bf-   alert, nad Heart: RRR Lungs: mostly clear Abdomen: obese, soft, non tender Extremities: really no peripheral edema     12/26/2021,9:32 AM  LOS: 2 days

## 2021-12-26 NOTE — Progress Notes (Signed)
SATURATION QUALIFICATIONS: (This note is used to comply with regulatory documentation for home oxygen)  Patient Saturations on Room Air at Rest = 98%  Patient Saturations on Room Air while Ambulating = 95%    Please briefly explain why patient needs home oxygen: pt ambulated in the hallway. Denies pain. Pt did not required supplemental oxygen.

## 2021-12-27 LAB — PARATHYROID HORMONE, INTACT (NO CA): PTH: 89 pg/mL — ABNORMAL HIGH (ref 15–65)

## 2021-12-29 ENCOUNTER — Telehealth: Payer: Self-pay

## 2021-12-29 NOTE — Telephone Encounter (Signed)
Transition Care Management Unsuccessful Follow-up Telephone Call  Date of discharge and from where:  12/26/2021-[place]   Attempts:  1st Attempt  Reason for unsuccessful TCM follow-up call:  Missing or invalid number  *Informed wrong number.

## 2021-12-30 NOTE — Telephone Encounter (Signed)
Transition Care Management Unsuccessful Follow-up Telephone Call  Date of discharge and from where:  12/26/2021-[place]   Attempts:  2nd Attempt  Reason for unsuccessful TCM follow-up call:  Missing or invalid number  *Informed wrong number

## 2021-12-31 NOTE — Telephone Encounter (Signed)
Transition Care Management Unsuccessful Follow-up Telephone Call  Date of discharge and from where:  12/26/2021-+[place]  Attempts:  3rd Attempt  Reason for unsuccessful TCM follow-up call:  Missing or invalid number  *Informed wrong number

## 2022-04-23 ENCOUNTER — Ambulatory Visit
Admission: RE | Admit: 2022-04-23 | Discharge: 2022-04-23 | Disposition: A | Discharge status: Home / Self Care | Payer: Medicare HMO | Source: Ambulatory Visit | Attending: Cardiovascular Disease | Admitting: Cardiovascular Disease

## 2022-04-23 ENCOUNTER — Other Ambulatory Visit: Payer: Self-pay | Admitting: Cardiovascular Disease

## 2022-04-23 DIAGNOSIS — M542 Cervicalgia: Secondary | ICD-10-CM

## 2022-04-23 DIAGNOSIS — M25511 Pain in right shoulder: Secondary | ICD-10-CM

## 2022-12-08 ENCOUNTER — Ambulatory Visit: Payer: Medicare HMO
